# Patient Record
Sex: Female | Born: 1950 | Race: White | Hispanic: No | Marital: Single | Smoking: Former smoker
Health system: Southern US, Community
[De-identification: ages and names within clinical notes are randomized; demographics above are authoritative.]

## PROBLEM LIST (undated history)

## (undated) DIAGNOSIS — G4733 Obstructive sleep apnea (adult) (pediatric): Secondary | ICD-10-CM

## (undated) DIAGNOSIS — IMO0002 Reserved for concepts with insufficient information to code with codable children: Secondary | ICD-10-CM

## (undated) DIAGNOSIS — J439 Emphysema, unspecified: Secondary | ICD-10-CM

## (undated) HISTORY — DX: Obstructive sleep apnea (adult) (pediatric): G47.33

## (undated) HISTORY — DX: Reserved for concepts with insufficient information to code with codable children: IMO0002

## (undated) HISTORY — DX: Emphysema, unspecified: J43.9

## (undated) HISTORY — PX: APPENDECTOMY: SHX54

---

## 2007-10-18 ENCOUNTER — Other Ambulatory Visit: Admission: RE | Admit: 2007-10-18 | Discharge: 2007-10-18 | Payer: Self-pay | Admitting: Family Medicine

## 2008-08-03 ENCOUNTER — Encounter: Admission: RE | Admit: 2008-08-03 | Discharge: 2008-08-03 | Payer: Self-pay | Admitting: Family Medicine

## 2010-08-13 HISTORY — PX: OTHER SURGICAL HISTORY: SHX169

## 2010-09-02 ENCOUNTER — Inpatient Hospital Stay (HOSPITAL_COMMUNITY): Payer: BC Managed Care – PPO

## 2010-09-02 ENCOUNTER — Emergency Department (HOSPITAL_COMMUNITY): Payer: BC Managed Care – PPO

## 2010-09-02 ENCOUNTER — Inpatient Hospital Stay (HOSPITAL_COMMUNITY)
Admission: EM | Admit: 2010-09-02 | Discharge: 2010-09-10 | DRG: 585 | Disposition: A | Discharge status: Home / Self Care | Payer: BC Managed Care – PPO | Attending: Surgery | Admitting: Surgery

## 2010-09-02 DIAGNOSIS — K649 Unspecified hemorrhoids: Secondary | ICD-10-CM | POA: Diagnosis present

## 2010-09-02 DIAGNOSIS — R197 Diarrhea, unspecified: Secondary | ICD-10-CM | POA: Diagnosis not present

## 2010-09-02 DIAGNOSIS — Y838 Other surgical procedures as the cause of abnormal reaction of the patient, or of later complication, without mention of misadventure at the time of the procedure: Secondary | ICD-10-CM | POA: Diagnosis not present

## 2010-09-02 DIAGNOSIS — R0902 Hypoxemia: Secondary | ICD-10-CM | POA: Diagnosis not present

## 2010-09-02 DIAGNOSIS — J9819 Other pulmonary collapse: Secondary | ICD-10-CM | POA: Diagnosis not present

## 2010-09-02 DIAGNOSIS — K55059 Acute (reversible) ischemia of intestine, part and extent unspecified: Secondary | ICD-10-CM | POA: Diagnosis present

## 2010-09-02 DIAGNOSIS — F17200 Nicotine dependence, unspecified, uncomplicated: Secondary | ICD-10-CM | POA: Diagnosis present

## 2010-09-02 DIAGNOSIS — K562 Volvulus: Principal | ICD-10-CM | POA: Diagnosis present

## 2010-09-02 DIAGNOSIS — J4489 Other specified chronic obstructive pulmonary disease: Secondary | ICD-10-CM | POA: Diagnosis present

## 2010-09-02 DIAGNOSIS — J988 Other specified respiratory disorders: Secondary | ICD-10-CM | POA: Diagnosis not present

## 2010-09-02 DIAGNOSIS — K560 Paralytic ileus: Secondary | ICD-10-CM | POA: Diagnosis not present

## 2010-09-02 DIAGNOSIS — J449 Chronic obstructive pulmonary disease, unspecified: Secondary | ICD-10-CM | POA: Diagnosis present

## 2010-09-02 LAB — DIFFERENTIAL
Basophils Absolute: 0 K/uL (ref 0.0–0.1)
Basophils Relative: 0 % (ref 0–1)
Eosinophils Absolute: 0.1 K/uL (ref 0.0–0.7)
Eosinophils Relative: 0 % (ref 0–5)
Lymphocytes Relative: 15 % (ref 12–46)
Lymphs Abs: 2 K/uL (ref 0.7–4.0)
Monocytes Absolute: 0.4 K/uL (ref 0.1–1.0)
Monocytes Relative: 3 % (ref 3–12)
Neutro Abs: 11 K/uL — ABNORMAL HIGH (ref 1.7–7.7)
Neutrophils Relative %: 82 % — ABNORMAL HIGH (ref 43–77)

## 2010-09-02 LAB — CBC
HCT: 40 % (ref 36.0–46.0)
Hemoglobin: 13.9 g/dL (ref 12.0–15.0)
MCH: 30.9 pg (ref 26.0–34.0)
MCHC: 34.8 g/dL (ref 30.0–36.0)
MCV: 88.9 fL (ref 78.0–100.0)
Platelets: 259 K/uL (ref 150–400)
RBC: 4.5 MIL/uL (ref 3.87–5.11)
RDW: 11.7 % (ref 11.5–15.5)
WBC: 13.5 K/uL — ABNORMAL HIGH (ref 4.0–10.5)

## 2010-09-02 LAB — URINALYSIS, ROUTINE W REFLEX MICROSCOPIC
Bilirubin Urine: NEGATIVE
Hgb urine dipstick: NEGATIVE
Ketones, ur: NEGATIVE mg/dL
Urine Glucose, Fasting: NEGATIVE mg/dL
pH: 5.5 (ref 5.0–8.0)

## 2010-09-02 LAB — COMPREHENSIVE METABOLIC PANEL
AST: 37 U/L (ref 0–37)
Albumin: 4.2 g/dL (ref 3.5–5.2)
BUN: 9 mg/dL (ref 6–23)
CO2: 25 mEq/L (ref 19–32)
Calcium: 9.4 mg/dL (ref 8.4–10.5)
Creatinine, Ser: 0.83 mg/dL (ref 0.4–1.2)
GFR calc Af Amer: >60 mL/min (ref >60)
GFR calc non Af Amer: >60 mL/min (ref >60)

## 2010-09-02 LAB — URINE MICROSCOPIC-ADD ON

## 2010-09-02 LAB — POCT CARDIAC MARKERS
CKMB, poc: 4.7 ng/mL (ref 1.0–8.0)
Myoglobin, poc: 99.5 ng/mL (ref 12–200)
Troponin i, poc: <0.05 ng/mL (ref 0.00–0.09)

## 2010-09-02 LAB — LIPASE, BLOOD: Lipase: 19 U/L (ref 11–59)

## 2010-09-02 MED ORDER — IOHEXOL 300 MG/ML  SOLN
100.0000 mL | Freq: Once | INTRAMUSCULAR | Status: AC | PRN
  Administered 2010-09-02: 100 mL via INTRAVENOUS

## 2010-09-03 ENCOUNTER — Inpatient Hospital Stay (HOSPITAL_COMMUNITY): Payer: BC Managed Care – PPO

## 2010-09-03 ENCOUNTER — Other Ambulatory Visit: Payer: Self-pay | Admitting: General Surgery

## 2010-09-03 LAB — BASIC METABOLIC PANEL
CO2: 25 mEq/L (ref 19–32)
Chloride: 101 mEq/L (ref 96–112)
Creatinine, Ser: 0.79 mg/dL (ref 0.4–1.2)
GFR calc Af Amer: >60 mL/min (ref >60)
Potassium: 4.2 mEq/L (ref 3.5–5.1)
Sodium: 133 mEq/L — ABNORMAL LOW (ref 135–145)

## 2010-09-03 LAB — PHOSPHORUS: Phosphorus: 3.7 mg/dL (ref 2.3–4.6)

## 2010-09-03 LAB — CBC
Hemoglobin: 15.4 g/dL — ABNORMAL HIGH (ref 12.0–15.0)
MCH: 31.6 pg (ref 26.0–34.0)
Platelets: 236 10*3/uL (ref 150–400)
RBC: 4.87 MIL/uL (ref 3.87–5.11)
WBC: 10.9 10*3/uL — ABNORMAL HIGH (ref 4.0–10.5)

## 2010-09-03 LAB — SURGICAL PCR SCREEN
MRSA, PCR: NEGATIVE
Staphylococcus aureus: POSITIVE — AB

## 2010-09-03 LAB — MAGNESIUM: Magnesium: 1.9 mg/dL (ref 1.5–2.5)

## 2010-09-04 LAB — BASIC METABOLIC PANEL
BUN: 8 mg/dL (ref 6–23)
Chloride: 98 mEq/L (ref 96–112)
GFR calc Af Amer: >60 mL/min (ref >60)
GFR calc non Af Amer: >60 mL/min (ref >60)
Potassium: 4 mEq/L (ref 3.5–5.1)
Sodium: 133 mEq/L — ABNORMAL LOW (ref 135–145)

## 2010-09-04 LAB — PHOSPHORUS: Phosphorus: 3 mg/dL (ref 2.3–4.6)

## 2010-09-04 LAB — CBC
Platelets: 179 10*3/uL (ref 150–400)
RBC: 3.97 MIL/uL (ref 3.87–5.11)
RDW: 11.9 % (ref 11.5–15.5)
WBC: 10.1 10*3/uL (ref 4.0–10.5)

## 2010-09-04 LAB — MAGNESIUM: Magnesium: 2.1 mg/dL (ref 1.5–2.5)

## 2010-09-05 ENCOUNTER — Inpatient Hospital Stay (HOSPITAL_COMMUNITY): Payer: BC Managed Care – PPO

## 2010-09-08 ENCOUNTER — Inpatient Hospital Stay (HOSPITAL_COMMUNITY): Payer: BC Managed Care – PPO

## 2010-09-08 DIAGNOSIS — J9819 Other pulmonary collapse: Secondary | ICD-10-CM

## 2010-09-08 DIAGNOSIS — J90 Pleural effusion, not elsewhere classified: Secondary | ICD-10-CM

## 2010-09-08 DIAGNOSIS — R599 Enlarged lymph nodes, unspecified: Secondary | ICD-10-CM

## 2010-09-08 DIAGNOSIS — R0902 Hypoxemia: Secondary | ICD-10-CM

## 2010-09-09 ENCOUNTER — Inpatient Hospital Stay (HOSPITAL_COMMUNITY): Payer: BC Managed Care – PPO

## 2010-09-09 DIAGNOSIS — R0902 Hypoxemia: Secondary | ICD-10-CM

## 2010-09-09 DIAGNOSIS — R599 Enlarged lymph nodes, unspecified: Secondary | ICD-10-CM

## 2010-09-09 DIAGNOSIS — J90 Pleural effusion, not elsewhere classified: Secondary | ICD-10-CM

## 2010-09-09 DIAGNOSIS — J9819 Other pulmonary collapse: Secondary | ICD-10-CM

## 2010-09-09 LAB — BASIC METABOLIC PANEL
BUN: 4 mg/dL — ABNORMAL LOW (ref 6–23)
CO2: 31 mEq/L (ref 19–32)
Chloride: 98 mEq/L (ref 96–112)
GFR calc non Af Amer: >60 mL/min (ref >60)
Glucose, Bld: 203 mg/dL — ABNORMAL HIGH (ref 70–99)
Potassium: 2.7 mEq/L — CL (ref 3.5–5.1)

## 2010-09-09 LAB — CBC
MCH: 30.3 pg (ref 26.0–34.0)
RBC: 3.57 MIL/uL — ABNORMAL LOW (ref 3.87–5.11)
RDW: 12.2 % (ref 11.5–15.5)

## 2010-09-09 LAB — MAGNESIUM: Magnesium: 1.9 mg/dL (ref 1.5–2.5)

## 2010-09-09 MED ORDER — IOHEXOL 300 MG/ML  SOLN
80.0000 mL | Freq: Once | INTRAMUSCULAR | Status: AC | PRN
  Administered 2010-09-09: 80 mL via INTRAVENOUS

## 2010-09-10 DIAGNOSIS — J438 Other emphysema: Secondary | ICD-10-CM

## 2010-09-10 LAB — COMPREHENSIVE METABOLIC PANEL
ALT: 40 U/L — ABNORMAL HIGH (ref 0–35)
AST: 44 U/L — ABNORMAL HIGH (ref 0–37)
Albumin: 2.6 g/dL — ABNORMAL LOW (ref 3.5–5.2)
Alkaline Phosphatase: 50 U/L (ref 39–117)
Chloride: 103 mEq/L (ref 96–112)
GFR calc Af Amer: >60 mL/min (ref >60)
Potassium: 4.4 mEq/L (ref 3.5–5.1)
Sodium: 137 mEq/L (ref 135–145)
Total Bilirubin: 0.6 mg/dL (ref 0.3–1.2)
Total Protein: 5.8 g/dL — ABNORMAL LOW (ref 6.0–8.3)

## 2010-09-18 ENCOUNTER — Encounter: Payer: Self-pay | Admitting: Pulmonary Disease

## 2010-09-18 NOTE — H&P (Signed)
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  HISTORY OF PRESENT ILLNESS:  Ms. Kuba is a 60 year old female who was awakened at 2 a.m. this morning with severe abdominal pain.  Over the course of the day, she has noticed that she has had a little bit of nausea and some bloating.  She describes no prior symptoms whatsoever. Before 2 a.m. she did not have any nausea, vomiting, constipation, diarrhea, or change in her appetite.  She describes no change in the caliber of her stool or weight loss.  No bleeding per rectum.  She did have a colonoscopy around 3 to 4 years ago with Dr. Elnoria Howard.  She said had diverticuli, but nothing else.  She describes her pain as 10/10 on admission which is down to around 2/10 after the pain medication.  PAST MEDICAL HISTORY:  Significant for no chronic illnesses.  PAST SURGICAL HISTORY:  Appendectomy.  FAMILY HISTORY:  Coronary artery disease and diabetes.  Sister with breast cancer in her 76s.  SOCIAL HISTORY:  She does not use any illicit drugs.  She is a former smoker and drinks around five alcohol beverages per week.  She is accompanied by friends.  REVIEW OF SYSTEMS:  Otherwise negative x 11 systems.  MEDICATIONS:  None.  ALLERGIES:  None.  PHYSICAL EXAMINATION:  GENERAL:  She is alert and oriented x3.  She is lying on her side and looks uncomfortable. VITAL SIGNS:  Temperature 97.4, heart rate 80, blood pressure 142/71, respiratory rate 20, and saturation 95% on room air.  She is alert and oriented x3. HEENT:  Normocephalic and atraumatic.  Sclerae are anicteric. NECK:  Supple.  No  lymphadenopathy.  No thyromegaly.  Trachea is midline.  Mucous membranes are moist. HEART:  Regular rate and rhythm.  No murmurs, rubs, or gallops. LUNGS:  Clear bilaterally.  No wheezing, rales, or rhonchi. ABDOMEN:  Soft and nontender.  It is distended and there are hypoactive bowel sounds.  She has a McBurney's incision in the right lower quadrant. MUSCULOSKELETAL:  No gross deformities.  No rashes are seen. NEUROLOGIC:  No gross motor or sensory deficits. PSYCHIATRIC:  Mood and affect are normal.  LABORATORY DATA:  Sodium 133, potassium 3.7, chloride 99, CO2 25, BUN 9, creatinine 0.83, glucose 127, and calcium 9.4.  LFTs normal.  White count 13.5, hemoglobin and hematocrit 13.9 and 40.0, and platelet count 259,000.  CT scan is positive for small amount of pelvic ascites and volvulus versus mass of the sigmoid.  There is narrowing of the lumen.  ASSESSMENT AND PLAN:  Ms. Ra is a 60 year old female with a volvulus.  I think this is much more likely than a mass given the acute nature of the pain and lack of  preceding symptoms.  She also had anormal colonoscopy around 3 years ago.  We will admit her and keep her n.p.o., give her IV fluids, IV antibiotics, and then GI consult for flexible sigmoidoscopy.  I have spoken to Dr. Elnoria Howard.  She will need some pain control and antibiotics and may possibly need surgery in the future.  This was assessed with the patient and she understands.     Almond Lint, MD     FB/MEDQ  D:  09/02/2010  T:  09/03/2010  Job:  [id number]  cc:   [facility]  Jordan Hawks. Elnoria Howard, MD Fax: [phone]  Electronically Signed by Almond Lint MD on 09/17/2010 01:07:20 PM

## 2010-09-18 NOTE — Op Note (Signed)
NAMEQUINTESSA, [name]               ACCOUNT NO.:  [id number]  MEDICAL RECORD NO.:  [id number]           PATIENT TYPE:  I  LOCATION:  1537                         FACILITY:  [facility]  PHYSICIAN:  Almond Lint, MD       DATE OF BIRTH:  12-Oct-1950  DATE OF PROCEDURE:  09/03/2010 DATE OF DISCHARGE:                              OPERATIVE REPORT   PREOPERATIVE DIAGNOSIS:  Sigmoid volvulus.  POSTOPERATIVE DIAGNOSIS:  Cecal volvulus.  PROCEDURE:  Ileocecectomy and On-Q pain pump placement.  SURGEON:  Almond Lint, MD  ASSISTANT:  Earney Hamburg, P.A.  ANESTHESIA:  General and local.  FINDINGS:  Necrotic cecum.  SPECIMENS:  Cecum to Pathology.  ESTIMATED BLOOD LOSS:  100 mL.  COMPLICATIONS:  None known.  PROCEDURE:  Hailey [name] was identified in the holding area and taken to the operating where she was placed supine on the operating room table. General anesthesia was induced.  Her abdomen and perineum were prepped and draped in sterile fashion.  Time-out was performed according to the surgical safety check list.  When all was correct, we continued.  A midline incision was made with a #10 blade.  Deep subcutaneous tissue was divided with cautery.  The abdomen was entered gently in the midline with the cautery.  The peritoneum was opened carefully by holding up with 2 tonsils and cutting with the Metzenbaum.  The fascia was opened up to full length of the skin incision.  There was immediately some murky bloody fluid in the abdomen.  Immediately visible was a large necrotic loop of colon.  This was delivered from the abdomen and detorsed.  This, after detorsing, was clearly the cecum.  Interestingly enough however, the ascending colon did go all the way down to the right lower quadrant with the white line of Toldt.  It was lifted as the actual cecum was free with a fixed ascending colon and hepatic flexure. She did not have any evidence of malrotation.  The ileum was identified and  a GIA 75 stapler was fired across this after creation of a mesenteric window.  The ascending colon was identified and a GIA 75 was passed across this.  The mesentery was then taken with the super draw. There were no bleeding vessels along the mesentery.  The specimen was passed off the table without any injury to the colon.  The hepatic flexure was then taken down with the cautery and blunt dissection.  Once this was done, the anastomosis could be performed.  Terminal ileum and the ascending colon were secured with the 3-0 Vicryl.  A crotch stitch was placed around 7 cm beyond this.  The bowel clamps were used to prevent backflow of fluid and gas.  The staple lines of the bowel were opened with cautery.  The GIA 75 was passed across and fired creating a side-to-side functional end-to-end anastomosis.  The defect was then examined and there was no bleeding seen from the staple line.  The defect was reapproximated with Allis clamps and a TA-60 was fired across this. Curved Mayo scissors were used to cut off the additional tissue beyond the stapler.  Gloves were changed and then the mesenteric defect was closed with running 2-0 Vicryl.  The abdomen was then copiously irrigated.  Additional apex stitch was placed at the crotch of the anastomosis for support.  5 L of saline was used to irrigate the abdomen.  The bowel was placed back in the abdominal cavity and the omentum was pulled down on top of the bowel.  An On-Q tunneler was placed in the lateral abdomen.  The peritoneum was then closed using a running 0 Vicryl.  The fascia was then closed using #1 PDS suture.  The skin was irrigated and closed with staples.  The catheters for the pain pump were placed through the tunnelers and these were bolused with 0.5% Marcaine plain.  The incision was cleaned, dried and dressed with a Coverlet dressing and the On-Q pain pumps were dressed with Tegaderm and Steri-Strips.  The patient was awakened from  anesthesia and taken to the PACU in a stable condition.  Needle, sponge and instrument counts were correct x2.     Almond Lint, MD     FB/MEDQ  D:  09/03/2010  T:  09/03/2010  Job:  [id number]  Electronically Signed by Almond Lint MD on 09/17/2010 01:08:46 PM

## 2010-09-18 NOTE — Discharge Summary (Signed)
Hailey [name], Hailey [name]               ACCOUNT NO.:  [id number]  MEDICAL RECORD NO.:  [id number]           PATIENT TYPE:  I  LOCATION:  1537                         FACILITY:  [facility]  PHYSICIAN:  Almond Lint, MD       DATE OF BIRTH:  [birth year]  DATE OF ADMISSION:  09/02/2010 DATE OF DISCHARGE:  09/10/2010                              DISCHARGE SUMMARY   ADMISSION DIAGNOSES: 1. Volvulus versus mass of the sigmoid colon. 2. Tobacco use. 3. History of appendectomy.  DISCHARGE DIAGNOSES: 1. 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 2. Postop hypoxia secondary to atelectasis and baseline chronic     obstructive pulmonary disease. 3. History of appendectomy.  PROCEDURES: 1. CT of the abdomen and pelvis, September 02, 2010. 2. Exploratory laparotomy and ileocecectomy, September 03, 2010, Dr.     Donell Beers. 3. CT of the chest, September 09, 2010.  CONSULTS:  Coralyn Helling, MD, Pulmonary.  BRIEF HISTORY:  The patient is a 60 year old female who awoke the morning of admission with severe abdominal pain.  Over the course the day, symptoms became worse.  She developed nausea and vomiting.  She had no prior symptoms before that.  She did not have any nausea, vomiting, constipation, diarrhea or change in her appetite.  She came to the ER and underwent evaluation.  CT scan obtained at that time showed focal distention of the sigmoid colon, decreased caliber of the descending colon and the rectum.  In the acute setting, this was thought to represent an sigmoid volvulus or focal obstruction due to a mass. There were some abdominal and pelvic ascites and small hypodensities within the liver parenchyma was too small to characterize.  There was distention at gastric lumen without evidence for high-grade outlet obstruction.  Based on the appearance of the CT, the patient went to endoscopy for decompression and detorsion.  She felt better in the evening after the procedure, but had worse pain  overnight. On September 03, 2010, she underwent the above-noted procedure, tolerated well and returned to the floor and did well.  Her diet was slowly advanced and she was up ambulating and actually doing quite well after her surgery on the fifth postoperative day.  At that time, it was noted by the staff that she had dropped her sats when out of bed and off oxygen.  They walked her which actually dropped her sats down into the 80s.  Her chest x-ray on February 24 showed bilateral small effusions, low lung volumes and emphysema consistent with atelectasis.  She had a history of tobacco use, one and a half packs a day for approximately 27 years.  At that point, we obtained a pulmonary consult from Dr. Craige Cotta.  He repeated her chest x-ray the following day and started her on some albuterol.  Because her sats were still down, she had some increasing effusion on the right and bibasilar atelectasis.  They recommended a CT just her rule out PE to get a better look at her pulmonary architecture.  CT was obtained on September 09, 2010.  This showed no evidence of pulmonary embolism. There were small bilateral  effusions and bibasilar atelectasis.  There is also a moderate amount of pulmonary emphysema and actually more than the expected.  The patient has been placed on albuterol.  She was on incentive.  A flutter valve was added and they started her on Spiriva. We tentatively plan to send her home on oxygen but walk test today showed her sats were improved, greater than the 88% and we opted to discontinue her oxygen.  She is to continue her pulmonary toilet and Spiriva.  From a GI standpoint, she is doing very well.  She is eating regular diet.  She is having some diarrhea and some irritation of her hemorrhoids, we put her on some Anusol for this but overall she is fine. She had her staples removed and Steri-Strips applied with instructions to remove those, in about a week they will come off.  She will  follow up with Dr. Donell Beers in 7 days to 2 weeks.  She will follow up with Dr. Craige Cotta in 2 weeks, appointment has been made.  DISCHARGE MEDS: 1. Will include her preadmission calcium 600 mg daily. 2. Vitamin C 500 mg daily. 3. Vitamin E daily. 4. Hydrocortisone cream p.r.n. for her hemorrhoids. 5. Oxycodone APAP 5/325, 1-2 tablets q.4 p.r.n. 6. She was also told she could take ibuprofen or plain Tylenol p.r.n. 7. She was started on Spiriva Aerolizer 18 mcg 1 inhalation daily.  We     gave her back up prescriptions for the Spiriva and albuterol if she     should need it.  The Pulmonary has already called in her     prescriptions for that.  She is instructed to call if she has any     problems.  We have an Urgent Care Clinic in the ER with 24-hour     followup as needed.     Eber Hong, P.A.   ______________________________ Almond Lint, MD    WDJ/MEDQ  D:  09/10/2010  T:  09/10/2010  Job:  [id number]  cc:   Coralyn Helling, MD [address] [place], [place] [address]  Marcos Eke. Hal Hope, M.D. Fax: [phone]  Electronically Signed by Sherrie George P.A. on 09/11/2010 04:33:23 PM Electronically Signed by Almond Lint MD on 09/17/2010 01:11:57 PM

## 2010-09-30 ENCOUNTER — Encounter: Payer: Self-pay | Admitting: Pulmonary Disease

## 2010-10-01 ENCOUNTER — Ambulatory Visit (INDEPENDENT_AMBULATORY_CARE_PROVIDER_SITE_OTHER)
Admission: RE | Admit: 2010-10-01 | Discharge: 2010-10-01 | Disposition: A | Discharge status: Home / Self Care | Payer: BC Managed Care – PPO | Source: Ambulatory Visit | Attending: Pulmonary Disease | Admitting: Pulmonary Disease

## 2010-10-01 ENCOUNTER — Encounter: Payer: Self-pay | Admitting: Pulmonary Disease

## 2010-10-01 ENCOUNTER — Ambulatory Visit (INDEPENDENT_AMBULATORY_CARE_PROVIDER_SITE_OTHER): Payer: BC Managed Care – PPO | Admitting: Pulmonary Disease

## 2010-10-01 DIAGNOSIS — J9811 Atelectasis: Secondary | ICD-10-CM

## 2010-10-01 DIAGNOSIS — J438 Other emphysema: Secondary | ICD-10-CM

## 2010-10-01 DIAGNOSIS — J90 Pleural effusion, not elsewhere classified: Secondary | ICD-10-CM | POA: Insufficient documentation

## 2010-10-01 DIAGNOSIS — J9819 Other pulmonary collapse: Secondary | ICD-10-CM

## 2010-10-01 MED ORDER — ALBUTEROL SULFATE HFA 108 (90 BASE) MCG/ACT IN AERS: 2.0000 | INHALATION_SPRAY | Freq: Four times a day (QID) | RESPIRATORY_TRACT | Status: DC | PRN

## 2010-10-01 NOTE — Assessment & Plan Note (Signed)
Has clinical improvement.  Will repeat chest xray today. 

## 2010-10-01 NOTE — Patient Instructions (Signed)
Chest xray today Spiriva one puff daily Proair two puffs up to four times per day as needed for cough, wheeze, chest congestion, or shortness of breath

## 2010-10-01 NOTE — Assessment & Plan Note (Signed)
Has clinical improvement.  Will repeat chest xray today.

## 2010-10-01 NOTE — Progress Notes (Signed)
  Subjective:    Patient ID: Hailey [name], female    DOB: 06-16-1951, 60 y.o.   MRN: [id number]  HPI CC: Hailey [name]. Hailey [name]   60 yo female with emphysema, will small pleural effusion and atelectasis after repair of sigmoid volvulus.  She continues to use spiriva.  She is not sure how much this is helping.  She does not have an albuterol inhaler.  She has been cleared by surgery to return to exercise.  She is starting to do cardio activities w/o difficulty with her breathing.   She has not had breathing tests before.  She has not had a chest xray since she was discharged from the hospital.  No resp symptoms no cxr No pft Had ptx 25 yrs ago, lt side No smoke Ok to start cardio [employer], [facility] [place]    Review of Systems  Constitutional: Negative for fever.  HENT: Negative for congestion, rhinorrhea and postnasal drip.   Respiratory: Negative for cough, chest tightness, shortness of breath and wheezing.   Cardiovascular: Negative for chest pain and leg swelling.  Gastrointestinal: Negative for nausea and abdominal pain.  Musculoskeletal: Negative for back pain and joint swelling.  Skin: Negative for rash.  Neurological: Negative for headaches.  Hematological: Negative for adenopathy.       Objective:   Physical Exam  Constitutional: She is oriented to person, place, and time. She appears well-developed and well-nourished. No distress.  HENT:  Nose: Nose normal.  Mouth/Throat: Oropharynx is clear and moist. No oropharyngeal exudate.  Eyes: EOM are normal. Pupils are equal, round, and reactive to light.  Neck: Neck supple. No JVD present. No tracheal deviation present.  Cardiovascular: Normal rate, regular rhythm and normal heart sounds.   No murmur heard. Pulmonary/Chest: Effort normal. No respiratory distress. She has no wheezes. She has no rales. She exhibits no tenderness.  Abdominal: Soft. Bowel sounds are normal.  Musculoskeletal: She exhibits no edema.    Lymphadenopathy:    She has no cervical adenopathy.  Neurological: She is alert and oriented to person, place, and time. No cranial nerve deficit.  Skin: Skin is warm and dry.  Psychiatric: She has a normal mood and affect.          Assessment & Plan:

## 2010-10-01 NOTE — Assessment & Plan Note (Signed)
She has severe obstruction on spirometry.  She has radiographic evidence of emphysema with prior history of tobacco abuse.  Will continue spiriva, and give as needed albuterol.

## 2010-10-02 ENCOUNTER — Telehealth: Payer: Self-pay | Admitting: Pulmonary Disease

## 2010-10-02 NOTE — Consult Note (Signed)
Hailey [name], Hailey [name]               ACCOUNT NO.:  [id number]  MEDICAL RECORD NO.:  [id number]           PATIENT TYPE:  I  LOCATION:  1537                         FACILITY:  [facility]  PHYSICIAN:  Coralyn Helling, MD        DATE OF BIRTH:  1951-02-28  DATE OF CONSULTATION:  09/08/2010 DATE OF DISCHARGE:                                CONSULTATION   REASON FOR CONSULTATION:  Hypoxia.  CONSULTING PHYSICIAN:  Dr. Johna Sheriff.  HISTORY OF PRESENT ILLNESS:  This is a very pleasant 60 year old female who reports that she was in her usual state of health up until September 02, 2010, when she was awoken that morning with severe abdominal discomfort.  Diagnostic evaluation ultimately demonstrated sigmoid volvulus.  She ultimately underwent emergent operation on September 03, 2010, which consisted of ileal cecotomy with postoperative diagnosis of cecal volvulus.  This is carried out by Dr. Almond Lint.  Her postoperative course actually was progressing quite well up until September 08, 2010.  At that time, she was noted to have episodes of desaturation as low as 81% on room air with activity.  Interestingly enough, Hailey [name] denied significant dyspnea.  She denied new cough, wheezing, chest pain.  Actually reports some mild exertional dyspnea after walking two laps in the hallway; however, she contributed this to simply fatigue following a major surgery.  The Pulmonary Service was asked to evaluate on September 08, 2010, to further evaluate her hypoxia.  PAST MEDICAL HISTORY:  Her past medical history is negative for chronic diseases.  She has had a prior appendectomy.  She was a prior smoker, but stopped 10 years ago.  PAST SURGICAL HISTORY:  As mentioned above, is appendectomy.  FAMILY HISTORY:  Her father had coronary artery disease and diabetes. She had a sister with breast cancer.  She has no history of COPD, emphysema, asthma, or pulmonary or thrombotic diseases.  SOCIAL HISTORY:  Former  smoker, stopped smoking approximately 10 years ago; she did smoke approximately one pack a day for 20 years.  She drinks socially.  She currently works for [employer] at home.  She lives a very active lifestyle, reports she sees a Psychologist, educational and works out three times a week doing multiple activities which include resistance training and aerobic activity.  She reports normally that dyspnea is not a barrier to exertion.  ALLERGIES:  No known drug allergies.  CURRENT MEDICATIONS: 1. Entereg 12 mg p.o. b.i.d. 2. Peridex b.i.d. 3. Lovenox 40 mg daily.  This was started on postop day #1. 4. Dilaudid p.r.n. 5. Zofran p.r.n. 6. Percocet p.r.n. 7. Phenergan p.r.n.  REVIEW OF SYSTEMS:  GENERAL:  Reports to feel no acute complaints following her major abdominal surgery.  HEENT:  Denies nasal drainage, sore throat, adenopathy.  PULMONARY:  Denies cough, chest pain, wheeze. Does endorse mild exertional dyspnea, again which she attributed to expected postoperative fatigue.  CARDIAC:  No palpitations, no chest pain, no orthopnea.  EXTREMITIES:  Denies swelling.  ABDOMEN:  No nausea.  No vomiting.  Tolerating diet now.  GU:  No difficulties. NEUROMUSCULAR:  No difficulties.  NEURO:  No difficulties.  PSYCHIATRIC: Within normal.  CURRENT PHYSICAL EXAM:  VITAL SIGNS:  Temperature 98.3, heart rate 76 to 86, respirations 20, blood pressure 124/76, saturation 96% on 2 L. Please note she apparently desaturated to 81% on room air with exertion. GENERAL PHYSICAL EXAM:  This is a well-developed 60 year old female in no acute distress on 2 L nasal cannula. HEENT:  She is normocephalic without jugular venous distention. PULMONARY:  Notable for crackles, right greater than left, in the bases. Her air entry is equal bilaterally without accessory muscle use. CARDIAC:  Regular rate and rhythm. ABDOMEN:  Soft, nontender. EXTREMITIES:  Without edema, tenderness, or pain. NEUROLOGICALLY:  Grossly  intact.  DIAGNOSTIC EVALUATION:  Chest x-ray personally reviewed on September 08, 2010, does demonstrate progressive right-sided basilar atelectasis which is increased since her prior chest film on the 24th.  She also has a small amount of atelectasis on the left; cannot rule out some degree of bilateral effusions.  Last blood chemistry, February 23:  Sodium 133, potassium 4, chloride 98, CO2 30, glucose 109, BUN 8, creatinine 0.73.  Hemoglobin 12.1, hematocrit 36, platelet count 179, white blood cell count 10.1.  IMPRESSION/PLAN:  Hypoxia.  This is most likely secondary to postoperative atelectasis.  She has a small amount of pleural effusion; however, I doubt this is contributing to her degree of exertional hypoxia.  No evidence of a pneumonia.  This would be a very low probability of pulmonary emboli given she has been on prophylactic Lovenox since postoperative day #1.  Hailey [name] could indeed have some degree of underlying chronic obstructive pulmonary disease which has been undiagnosed in the past.  Certainly this is a consideration given how well she has tolerated her hypoxia.  Recommendations at this point would be to first focus on pulmonary hygiene measures.  We have gone ahead and ordered pulmonary hygiene in the form of bronchodilators, flutter valve, EZ-PAP, and incentive spirometry.  We will follow her chest x-ray and her symptomatic process.  After follow-up chest x-ray and in evaluation of her current clinical status, we will later decide on timing of potential pulmonary function testing to further answer the question as to whether or not she has underlying obstructive lung disease.  Finally, it is likely Hailey [name] will require supplemental oxygen upon time of discharge.  Thank you for the opportunity to see Hailey [name].  We will continue to follow her along with you.     Zenia Resides, NP   ______________________________ Coralyn Helling, MD    PB/MEDQ  D:   09/08/2010  T:  09/08/2010  Job:  [id number]  Electronically Signed by Zenia Resides NP on 09/29/2010 10:09:13 PM Electronically Signed by Coralyn Helling MD on 10/02/2010 12:18:30 PM

## 2010-10-02 NOTE — Telephone Encounter (Signed)
CXR reviewed.  Will have my nurse call to inform patient that chest xray shows emphysema as before, but that fluid around lungs as resolved.  No change to current tx plan.

## 2010-10-02 NOTE — Telephone Encounter (Signed)
Lm with family member for pt to call back.

## 2010-10-03 NOTE — Telephone Encounter (Signed)
Carver Fila, CMA Carver Fila, [place]

## 2010-10-03 NOTE — Telephone Encounter (Signed)
Spoke with pt and notified of the above cxr results per VS.  Pt verbalized understanding.

## 2010-10-14 NOTE — Consult Note (Signed)
  Hailey [name], Hailey [name]               ACCOUNT NO.:  [id number]  MEDICAL RECORD NO.:  [id number]           PATIENT TYPE:  I  LOCATION:  1307                         FACILITY:  [facility]  PHYSICIAN:  Jordan Hawks. Elnoria Howard, MD    DATE OF BIRTH:  05/02/1951  DATE OF CONSULTATION:  09/02/2010 DATE OF DISCHARGE:                                CONSULTATION   REASON FOR CONSULTATION:  Sigmoid volvulus.  HISTORY OF PRESENT ILLNESS:  This is a 60 year old female without any significant past medical history who status post appendectomy was admitted to the hospital with complaints of severe abdominal pain.  Pain started acutely at 2:30 a.m., it woke her up.  Before this time, she did not have any issues with her colon.  She underwent a colonoscopy by Dr. Elnoria Howard in 2007 with negative findings, and she has been well from the GI standpoint since that time.  When she arrived to the hospital, further workup revealed that she had a sigmoid volvulus and subsequently GI consultation was requested for further evaluation and treatment.  PAST MEDICAL HISTORY AND PAST SURGICAL HISTORY:  As stated above.  FAMILY HISTORY:  Noncontributory.  SOCIAL HISTORY:  Negative for illicit drug use or tobacco, although she is a former smoker, and she is positive for alcohol approximately 5 drinks per week.  REVIEW OF SYSTEMS:  As stated above in history of present illness, otherwise negative.  HOME MEDICATIONS:  None.  ALLERGIES:  No known drug allergies.  PHYSICAL EXAMINATION:  VITAL SIGNS:  Stable. GENERAL:  The patient is in no acute distress, alert and oriented. HEENT:  Normocephalic, atraumatic.  Extraocular muscles intact. NECK:  Supple.  No lymphadenopathy. LUNGS:  Clear to auscultation bilaterally. CARDIOVASCULAR:  Regular rate and rhythm. ABDOMEN:  Tense, distended, and tympanic.  It is tender to palpation but no rebound. EXTREMITIES:  No clubbing, cyanosis, or edema.  LABORATORY VALUES:  Reviewed at this  time.  IMPRESSION AND PLAN:  Sigmoid volvulus.  I am uncertain about the cause of the sigmoid volvulus.  I doubt that there is an obstructive mass that has resulted in this type of situation as her colonoscopy was well prepped and it was negative approximately 5 years ago.  Further evaluation with a sigmoidoscopy and an attempt to decompress the sigmoid colon will be performed.  Further recommendations will be made pending the findings.     Jordan Hawks Elnoria Howard, MD     PDH/MEDQ  D:  09/03/2010  T:  09/03/2010  Job:  [id number]  Electronically Signed by Jeani Hawking MD on 10/14/2010 08:51:43 AM

## 2010-12-12 ENCOUNTER — Telehealth: Payer: Self-pay | Admitting: Pulmonary Disease

## 2010-12-12 MED ORDER — TIOTROPIUM BROMIDE MONOHYDRATE 18 MCG IN CAPS: 18.0000 ug | ORAL_CAPSULE | Freq: Every day | RESPIRATORY_TRACT | Status: DC

## 2010-12-12 NOTE — Telephone Encounter (Signed)
Called spoke with patient to verify that she is requesting refills on her spiriva to [facility].  Pt last seen by VS 3.21.12, to follow up 6.6.12.  Refills sent to [facility].

## 2010-12-16 ENCOUNTER — Ambulatory Visit (INDEPENDENT_AMBULATORY_CARE_PROVIDER_SITE_OTHER): Payer: BC Managed Care – PPO | Admitting: Pulmonary Disease

## 2010-12-16 ENCOUNTER — Encounter: Payer: Self-pay | Admitting: Pulmonary Disease

## 2010-12-16 DIAGNOSIS — J439 Emphysema, unspecified: Secondary | ICD-10-CM

## 2010-12-16 DIAGNOSIS — J438 Other emphysema: Secondary | ICD-10-CM

## 2010-12-16 NOTE — Assessment & Plan Note (Signed)
She is not sure spiriva is helping.  She has not needed to use her rescue inhaler.  Will have her stop spiriva and monitor her symptoms.  She is to call if she notices any difference.  Otherwise, can follow up with pulmonary as needed.  Also discussed testing for alpha 1 anti-trypsin deficiency.  She is not aware of any family history of emphysema, and did not feel this testing was needed at this time.

## 2010-12-16 NOTE — Patient Instructions (Signed)
Stop spiriva Call if you notice your breathing gets worse after spiriva stopped Otherwise, follow up with pulmonary as needed

## 2010-12-16 NOTE — Progress Notes (Signed)
Subjective:    Patient ID: Hailey [name], female    DOB: 01-16-1951, 60 y.o.   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  She continues to use spiriva reluctantly.  She does not think this has helped much.  She has not been using her albuterol.  She denies cough, wheeze, sputum, chest pain, or hemoptysis.  She was told by her trainer that her recovery time has improved recently when she is exercising.  Past Medical History  Diagnosis Date  . Emphysema   . Pneumothorax      Family History  Problem Relation Age of Onset  . Coronary artery disease Father   . Diabetes Father   . Cancer Sister     breast     History   Social History  . Marital Status: Single    Spouse Name: N/A    Number of Children: N/A  . Years of Education: N/A   Occupational History  . Not on file.   Social History Main Topics  . Smoking status: Former Smoker -- 1.0 packs/day for 20 years    Types: Cigarettes    Quit date: 07/13/2000  . Smokeless tobacco: Not on file  . Alcohol Use: Yes     occasional dinks  . Drug Use: Not on file  . Sexually Active: Not on file   Other Topics Concern  . Not on file   Social History Narrative  . No narrative on file     No Known Allergies   Outpatient Prescriptions Prior to Visit  Medication Sig Dispense Refill  . Ascorbic Acid (VITAMIN C) 500 MG tablet Take 500 mg by mouth daily.        . calcium carbonate (OS-CAL) 600 MG TABS Take 600 mg by mouth daily.        . hydrocortisone 0.5 % cream Apply topically as needed.        Marland Kitchen oxyCODONE-acetaminophen (PERCOCET) 5-325 MG per tablet Take 1 tablet by mouth every 4 (four) hours as needed.        . tiotropium (SPIRIVA) 18 MCG inhalation capsule Place 1 capsule (18 mcg total) into inhaler and inhale daily.  90 capsule  3  . albuterol (PROAIR HFA) 108 (90 BASE) MCG/ACT inhaler Inhale 2 puffs into the lungs 4 (four) times daily as needed for wheezing or shortness of breath.  1 Inhaler  6    Review of Systems     Objective:   Physical Exam  BP 114/80  Pulse 73  Temp(Src) 97.7 F (36.5 C) (Oral)  Ht 5\' 2"  (1.575 m)  Wt 117 lb (53.071 kg)  BMI 21.40 kg/m2  SpO2 95%  Constitutional: She is oriented to person, place, and time. She appears well-developed and well-nourished. No distress.  HENT:  Nose: Nose normal.  Mouth/Throat: Oropharynx is clear and moist. No oropharyngeal exudate.  Eyes: EOM are normal. Pupils are equal, round, and reactive to light.  Neck: Neck supple. No JVD present. No tracheal deviation present.  Cardiovascular: Normal rate, regular rhythm and normal heart sounds.  No murmur heard.  Pulmonary/Chest: Effort normal. No respiratory distress. She has no wheezes. She has no rales. She exhibits no tenderness.  Abdominal: Soft. Bowel sounds are normal.  Musculoskeletal: She exhibits no edema.  Lymphadenopathy:  She has no cervical adenopathy.  Neurological: She is alert and oriented to person, place, and time. No cranial nerve deficit.  Skin: Skin is warm and dry.  Psychiatric: She has a normal mood and  affect.       Assessment & Plan:   Emphysema She is not sure spiriva is helping.  She has not needed to use her rescue inhaler.  Will have her stop spiriva and monitor her symptoms.  She is to call if she notices any difference.  Otherwise, can follow up with pulmonary as needed.  Also discussed testing for alpha 1 anti-trypsin deficiency.  She is not aware of any family history of emphysema, and did not feel this testing was needed at this time.    Updated Medication List Outpatient Encounter Prescriptions as of 12/16/2010  Medication Sig Dispense Refill  . Ascorbic Acid (VITAMIN C) 500 MG tablet Take 500 mg by mouth daily.        . calcium carbonate (OS-CAL) 600 MG TABS Take 600 mg by mouth daily.        . hydrocortisone 0.5 % cream Apply topically as needed.        Marland Kitchen oxyCODONE-acetaminophen (PERCOCET) 5-325 MG per tablet Take 1 tablet by  mouth every 4 (four) hours as needed.        Marland Kitchen DISCONTD: tiotropium (SPIRIVA) 18 MCG inhalation capsule Place 1 capsule (18 mcg total) into inhaler and inhale daily.  90 capsule  3  . albuterol (PROAIR HFA) 108 (90 BASE) MCG/ACT inhaler Inhale 2 puffs into the lungs 4 (four) times daily as needed for wheezing or shortness of breath.  1 Inhaler  6

## 2011-05-04 ENCOUNTER — Ambulatory Visit: Payer: BC Managed Care – PPO | Attending: Family Medicine | Admitting: Physical Therapy

## 2011-05-04 DIAGNOSIS — M25619 Stiffness of unspecified shoulder, not elsewhere classified: Secondary | ICD-10-CM | POA: Insufficient documentation

## 2011-05-04 DIAGNOSIS — IMO0001 Reserved for inherently not codable concepts without codable children: Secondary | ICD-10-CM | POA: Insufficient documentation

## 2011-05-04 DIAGNOSIS — M25519 Pain in unspecified shoulder: Secondary | ICD-10-CM | POA: Insufficient documentation

## 2011-05-07 ENCOUNTER — Ambulatory Visit: Payer: BC Managed Care – PPO | Admitting: Physical Therapy

## 2011-05-11 ENCOUNTER — Ambulatory Visit: Payer: BC Managed Care – PPO

## 2011-05-13 ENCOUNTER — Ambulatory Visit: Payer: BC Managed Care – PPO

## 2011-05-18 ENCOUNTER — Ambulatory Visit: Payer: BC Managed Care – PPO | Attending: Family Medicine

## 2011-05-18 DIAGNOSIS — M25619 Stiffness of unspecified shoulder, not elsewhere classified: Secondary | ICD-10-CM | POA: Insufficient documentation

## 2011-05-18 DIAGNOSIS — M25519 Pain in unspecified shoulder: Secondary | ICD-10-CM | POA: Insufficient documentation

## 2011-05-18 DIAGNOSIS — IMO0001 Reserved for inherently not codable concepts without codable children: Secondary | ICD-10-CM | POA: Insufficient documentation

## 2011-05-20 ENCOUNTER — Ambulatory Visit: Payer: BC Managed Care – PPO

## 2011-05-27 ENCOUNTER — Ambulatory Visit: Payer: BC Managed Care – PPO

## 2011-06-01 ENCOUNTER — Encounter: Payer: BC Managed Care – PPO | Admitting: Physical Therapy

## 2012-05-30 ENCOUNTER — Other Ambulatory Visit (HOSPITAL_COMMUNITY)
Admission: RE | Admit: 2012-05-30 | Discharge: 2012-05-30 | Disposition: A | Discharge status: Home / Self Care | Payer: 59 | Source: Ambulatory Visit | Attending: Family Medicine | Admitting: Family Medicine

## 2012-05-30 ENCOUNTER — Other Ambulatory Visit: Payer: Self-pay | Admitting: Family Medicine

## 2012-05-30 DIAGNOSIS — Z124 Encounter for screening for malignant neoplasm of cervix: Secondary | ICD-10-CM | POA: Insufficient documentation

## 2013-09-13 ENCOUNTER — Other Ambulatory Visit: Payer: Self-pay | Admitting: Family Medicine

## 2013-09-13 DIAGNOSIS — R9389 Abnormal findings on diagnostic imaging of other specified body structures: Secondary | ICD-10-CM

## 2013-09-15 ENCOUNTER — Ambulatory Visit
Admission: RE | Admit: 2013-09-15 | Discharge: 2013-09-15 | Disposition: A | Discharge status: Home / Self Care | Payer: 59 | Source: Ambulatory Visit | Attending: Family Medicine | Admitting: Family Medicine

## 2013-09-15 DIAGNOSIS — R9389 Abnormal findings on diagnostic imaging of other specified body structures: Secondary | ICD-10-CM

## 2013-09-15 MED ORDER — IOHEXOL 300 MG/ML  SOLN
75.0000 mL | Freq: Once | INTRAMUSCULAR | Status: AC | PRN
  Administered 2013-09-15: 75 mL via INTRAVENOUS

## 2013-10-04 ENCOUNTER — Ambulatory Visit: Payer: BC Managed Care – PPO | Admitting: Pulmonary Disease

## 2013-10-06 ENCOUNTER — Encounter: Payer: Self-pay | Admitting: Pulmonary Disease

## 2013-10-06 ENCOUNTER — Encounter (INDEPENDENT_AMBULATORY_CARE_PROVIDER_SITE_OTHER): Payer: Self-pay

## 2013-10-06 ENCOUNTER — Ambulatory Visit (INDEPENDENT_AMBULATORY_CARE_PROVIDER_SITE_OTHER): Payer: 59 | Admitting: Pulmonary Disease

## 2013-10-06 VITALS — BP 144/82 | HR 83 | Temp 97.2°F | Ht 62.0 in | Wt 130.6 lb

## 2013-10-06 DIAGNOSIS — I272 Pulmonary hypertension, unspecified: Secondary | ICD-10-CM | POA: Insufficient documentation

## 2013-10-06 DIAGNOSIS — J438 Other emphysema: Secondary | ICD-10-CM

## 2013-10-06 DIAGNOSIS — I2789 Other specified pulmonary heart diseases: Secondary | ICD-10-CM

## 2013-10-06 DIAGNOSIS — J439 Emphysema, unspecified: Secondary | ICD-10-CM

## 2013-10-06 MED ORDER — BUDESONIDE-FORMOTEROL FUMARATE 160-4.5 MCG/ACT IN AERO: 2.0000 | INHALATION_SPRAY | Freq: Two times a day (BID) | RESPIRATORY_TRACT | Status: DC

## 2013-10-06 NOTE — Progress Notes (Signed)
Chief Complaint  Patient presents with  . Follow-up    Pt hasnt been seen by Dr. Craige CottaSood since 12/2010. Pt experiencing SOB since Feb. pt was given zpak and pred. Pts breathing improved then worsened after taking medication. Dry cough x 3 months.     History of Present Illness: Hailey [name] is a 63 y.o. female former smoker with COPD/emphysema.  I last saw herin June 2012.  She noticed more trouble with her breathing for the past few weeks.  [place] happened.  She had a chest xray in February which showed hilar fullness.  As a result she was referred back to pulmonary medicine.  She is not smoking.  She was started on spiriva again three weeks ago >> this has helped some.  She is needing to use albuterol 3 to 4 times per day.  She is getting cough with occasional clear sputum.  She denies fever or hemoptysis.  She is not having sinus congestion or reflux.  She has been using mucinex, and this helps some.  TESTS: CT chest 09/09/10 >> moderate emphysema Spirometry 10/01/10 >> FEV1 0.97(42%), FEV1% 36 CT chest 09/15/13 >> atherosclerosis, enlarged pulmonary arteries, moderate/severe centrilobular/paraseptal    Hailey SchwalbeLinda [name]  has a past medical history of Emphysema and Pneumothorax.  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  Prior to Admission medications   Medication Sig Start Date End Date Taking? Authorizing Provider  Ascorbic Acid (VITAMIN C) 500 MG tablet Take 500 mg by mouth daily.     Yes Historical Provider, MD  calcium carbonate (OS-CAL) 600 MG TABS Take 600 mg by mouth daily.     Yes Historical Provider, MD  hydrocortisone 0.5 % cream Apply topically as needed.     Yes Historical Provider, MD  SPIRIVA HANDIHALER 18 MCG inhalation capsule Place into inhaler and inhale daily. Pt taking two puffs once daily.   Yes Historical Provider, MD  Vitamin D, Ergocalciferol, (DRISDOL) 50000 UNITS CAPS capsule Take 1  capsule by mouth daily.   Yes Historical Provider, MD  vitamin E 100 UNIT capsule Take 200 Units by mouth daily.   Yes Historical Provider, MD  albuterol (PROAIR HFA) 108 (90 BASE) MCG/ACT inhaler Inhale 2 puffs into the lungs 4 (four) times daily as needed for wheezing or shortness of breath. 10/01/10 10/01/11  Coralyn HellingVineet Sarahann Horrell, MD    No Known Allergies   Physical Exam:  General - No distress ENT - No sinus tenderness, no oral exudate, no LAN Cardiac - s1s2 regular, no murmur Chest - No wheeze/rales/dullness Back - No focal tenderness Abd - Soft, non-tender Ext - No edema Neuro - Normal strength Skin - No rashes Psych - normal mood, and behavior   Assessment/Plan:  Coralyn HellingVineet Wesly Whisenant, MD [place] [facility]/Critical Care/Sleep Pager:  [phone]

## 2013-10-06 NOTE — Assessment & Plan Note (Signed)
Recent CXR findings likely related to pulmonary artery fullness.  Will check overnight oximetry on room air.  May need additional evaluation with Echo.

## 2013-10-06 NOTE — Patient Instructions (Addendum)
Symbicort two puffs twice per day, and rinse mouth after each use Spiriva one puff daily Albuterol two puffs up to four times per day as needed for cough, wheeze, or chest congestion Will arrange for overnight oxygen test Follow up in 4 weeks

## 2013-10-06 NOTE — Assessment & Plan Note (Signed)
Will continue spiriva.  Will give her trial of symbicort >> samples given.  Will continue prn albuterol.  She will need to have repeat PFT's and check A1AT level >> discuss more at next visit.

## 2013-10-11 ENCOUNTER — Encounter: Payer: Self-pay | Admitting: Pulmonary Disease

## 2013-10-23 ENCOUNTER — Encounter: Payer: Self-pay | Admitting: Pulmonary Disease

## 2013-11-02 ENCOUNTER — Telehealth: Payer: Self-pay | Admitting: Pulmonary Disease

## 2013-11-02 MED ORDER — BUDESONIDE-FORMOTEROL FUMARATE 160-4.5 MCG/ACT IN AERO: 2.0000 | INHALATION_SPRAY | Freq: Two times a day (BID) | RESPIRATORY_TRACT | Status: DC

## 2013-11-02 NOTE — Telephone Encounter (Signed)
Called and spoke with pt and she stated that [facility] not have the refill of the symbicort.  This rx has been sent to the pharmacy again and pt is aware. Nothing further is needed.

## 2013-11-06 ENCOUNTER — Ambulatory Visit (INDEPENDENT_AMBULATORY_CARE_PROVIDER_SITE_OTHER): Payer: 59 | Admitting: Pulmonary Disease

## 2013-11-06 ENCOUNTER — Other Ambulatory Visit: Payer: 59

## 2013-11-06 ENCOUNTER — Encounter: Payer: Self-pay | Admitting: Pulmonary Disease

## 2013-11-06 VITALS — BP 116/82 | HR 75 | Ht 63.0 in | Wt 134.0 lb

## 2013-11-06 DIAGNOSIS — J439 Emphysema, unspecified: Secondary | ICD-10-CM

## 2013-11-06 DIAGNOSIS — I272 Pulmonary hypertension, unspecified: Secondary | ICD-10-CM

## 2013-11-06 DIAGNOSIS — J438 Other emphysema: Secondary | ICD-10-CM

## 2013-11-06 DIAGNOSIS — I2789 Other specified pulmonary heart diseases: Secondary | ICD-10-CM

## 2013-11-06 NOTE — Assessment & Plan Note (Signed)
Much better.  Will have her stop spiriva and continue symbicort.  She is to have A1AT testing today, will arrange for PFT, and will arrange for referral to pulmonary rehab.

## 2013-11-06 NOTE — Assessment & Plan Note (Signed)
She reports that she had her ONO >> will try to get copy of this.

## 2013-11-06 NOTE — Progress Notes (Signed)
Chief Complaint  Patient presents with  . COPD    Breathing is much improved. Symbicort is working well.    History of Present Illness: Hailey [name] is a 63 y.o. female former smoker with COPD/emphysema.  She has been doing much better with symbicort.  She has skipped spiriva a couple of times, and didn't notice any difference.  She has not needed to use proair.  She is not having as much cough or wheeze.  She denies swelling or chest pain.  TESTS: CT chest 09/09/10 >> moderate emphysema Spirometry 10/01/10 >> FEV1 0.97(42%), FEV1% 36 CT chest 09/15/13 >> atherosclerosis, enlarged pulmonary arteries, moderate/severe centrilobular/paraseptal    Hailey [name]  has a past medical history of Emphysema and Pneumothorax.  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  Prior to Admission medications   Medication Sig Start Date End Date Taking? Authorizing Provider  Ascorbic Acid (VITAMIN C) 500 MG tablet Take 500 mg by mouth daily.     Yes Historical Provider, MD  calcium carbonate (OS-CAL) 600 MG TABS Take 600 mg by mouth daily.     Yes Historical Provider, MD  hydrocortisone 0.5 % cream Apply topically as needed.     Yes Historical Provider, MD  SPIRIVA HANDIHALER 18 MCG inhalation capsule Place into inhaler and inhale daily. Pt taking two puffs once daily.   Yes Historical Provider, MD  Vitamin D, Ergocalciferol, (DRISDOL) 50000 UNITS CAPS capsule Take 1 capsule by mouth daily.   Yes Historical Provider, MD  vitamin E 100 UNIT capsule Take 200 Units by mouth daily.   Yes Historical Provider, MD  albuterol (PROAIR HFA) 108 (90 BASE) MCG/ACT inhaler Inhale 2 puffs into the lungs 4 (four) times daily as needed for wheezing or shortness of breath. 10/01/10 10/01/11  Coralyn HellingVineet Jeriko Kowalke, MD    No Known Allergies   Physical Exam:  General - No distress ENT - No sinus tenderness, no oral exudate, no LAN Cardiac - s1s2 regular, no murmur Chest  - No wheeze/rales/dullness Back - No focal tenderness Abd - Soft, non-tender Ext - No edema Neuro - Normal strength Skin - No rashes Psych - normal mood, and behavior   Assessment/Plan:  Coralyn HellingVineet Mcdonald Reiling, MD [place] [facility]/Critical Care/Sleep Pager:  [phone]

## 2013-11-06 NOTE — Patient Instructions (Signed)
Will schedule breathing test (PFT) Lab test today Will arrange for referral to pulmonary rehab Can try stopping spiriva Will get copy of overnight oxygen test Follow up in 4 months

## 2013-11-07 ENCOUNTER — Telehealth: Payer: Self-pay | Admitting: Pulmonary Disease

## 2013-11-07 DIAGNOSIS — I272 Pulmonary hypertension, unspecified: Secondary | ICD-10-CM

## 2013-11-07 DIAGNOSIS — R0902 Hypoxemia: Secondary | ICD-10-CM

## 2013-11-07 NOTE — Telephone Encounter (Signed)
Pt is aware of ONO results. She is agreeing to have echo and sleep study done. Orders will be placed for these tests.

## 2013-11-07 NOTE — Telephone Encounter (Signed)
ONO with RA 10/16/13 >> test time 8 hrs 1 min.  Baseline SpO2 92%, low SpO2 78%.  Spent 23 min with SpO2 < 88%.  Attempted to contact pt.  Will have my nurse f/u with pt.  Please inform her that her ONO shows low oxygen at night.  This is likely reason for her having enlarged pulmonary arteries on recent chest xray.  She will need to have Echocardiogram and sleep study to further assess.  If pt is agreeable, then please place order for echo w/o contrast to assess for pulmonary hypertension, and split night sleep study.

## 2013-11-10 ENCOUNTER — Other Ambulatory Visit: Payer: Self-pay | Admitting: *Deleted

## 2013-11-10 MED ORDER — BUDESONIDE-FORMOTEROL FUMARATE 160-4.5 MCG/ACT IN AERO: 2.0000 | INHALATION_SPRAY | Freq: Two times a day (BID) | RESPIRATORY_TRACT | Status: DC

## 2013-11-14 LAB — ALPHA-1 ANTITRYPSIN PHENOTYPE: A1 ANTITRYPSIN: 140 mg/dL (ref 83–199)

## 2013-11-15 ENCOUNTER — Telehealth: Payer: Self-pay | Admitting: Pulmonary Disease

## 2013-11-15 NOTE — Telephone Encounter (Signed)
Pt is aware of results. 

## 2013-11-15 NOTE — Telephone Encounter (Signed)
A1AT 11/06/13 >> 140, MM  Will have my nurse inform pt that lab test for emphysema was normal.  She does not have inherited form of emphysema.

## 2013-11-17 ENCOUNTER — Telehealth (HOSPITAL_COMMUNITY): Payer: Self-pay

## 2013-11-17 NOTE — Telephone Encounter (Signed)
Called patient regarding entrance to Pulmonary Rehab.  Patient is going to verify insurance coverage and follow up.

## 2013-11-21 ENCOUNTER — Ambulatory Visit (HOSPITAL_COMMUNITY): Payer: 59 | Attending: Cardiovascular Disease | Admitting: Radiology

## 2013-11-21 DIAGNOSIS — I2789 Other specified pulmonary heart diseases: Secondary | ICD-10-CM | POA: Insufficient documentation

## 2013-11-21 DIAGNOSIS — R0602 Shortness of breath: Secondary | ICD-10-CM

## 2013-11-21 DIAGNOSIS — I272 Pulmonary hypertension, unspecified: Secondary | ICD-10-CM

## 2013-11-21 NOTE — Progress Notes (Signed)
Echocardiogram performed.  

## 2013-11-23 ENCOUNTER — Telehealth: Payer: Self-pay | Admitting: Pulmonary Disease

## 2013-11-23 NOTE — Telephone Encounter (Signed)
Pt is aware of results. 

## 2013-11-23 NOTE — Telephone Encounter (Signed)
Echo 11/21/13 >> EF 65 to 70%, mild RV dilation.  Will have my nurse inform pt that Echo looked okay.  Will discuss in more detail at next ROV.

## 2013-11-27 ENCOUNTER — Ambulatory Visit (INDEPENDENT_AMBULATORY_CARE_PROVIDER_SITE_OTHER): Payer: 59 | Admitting: Pulmonary Disease

## 2013-11-27 DIAGNOSIS — J438 Other emphysema: Secondary | ICD-10-CM

## 2013-11-27 DIAGNOSIS — J439 Emphysema, unspecified: Secondary | ICD-10-CM

## 2013-11-27 NOTE — Progress Notes (Signed)
PFT done today. 

## 2013-11-28 ENCOUNTER — Telehealth: Payer: Self-pay | Admitting: Pulmonary Disease

## 2013-11-28 NOTE — Telephone Encounter (Signed)
PFT 11/27/13 >> FEV1 1.09 (51%), FEV1% 51, TLC 4.76 (107%), DLCO 69%, +BD  Will have my nurse inform pt that PFT shows expected changes from COPD, emphysema and asthma.  She is to continue symbicort and prn proair.  No other change to current therapy.

## 2013-11-28 NOTE — Telephone Encounter (Signed)
Pt is aware of results. 

## 2013-12-01 ENCOUNTER — Other Ambulatory Visit: Payer: Self-pay | Admitting: Pulmonary Disease

## 2013-12-05 ENCOUNTER — Telehealth: Payer: Self-pay | Admitting: Pulmonary Disease

## 2013-12-05 MED ORDER — TIOTROPIUM BROMIDE MONOHYDRATE 18 MCG IN CAPS: 18.0000 ug | ORAL_CAPSULE | Freq: Every day | RESPIRATORY_TRACT | Status: DC

## 2013-12-05 NOTE — Telephone Encounter (Signed)
Pt is needing refill on Spiriva. This has been sent in. Pt is aware.

## 2013-12-17 ENCOUNTER — Encounter (HOSPITAL_BASED_OUTPATIENT_CLINIC_OR_DEPARTMENT_OTHER): Payer: 59

## 2013-12-18 ENCOUNTER — Encounter (HOSPITAL_COMMUNITY): Payer: Self-pay

## 2013-12-18 ENCOUNTER — Encounter (HOSPITAL_COMMUNITY)
Admission: RE | Admit: 2013-12-18 | Discharge: 2013-12-18 | Disposition: A | Discharge status: Home / Self Care | Payer: 59 | Source: Ambulatory Visit | Attending: Pulmonary Disease | Admitting: Pulmonary Disease

## 2013-12-18 VITALS — BP 106/74 | HR 74 | Resp 18 | Ht 61.0 in | Wt 132.7 lb

## 2013-12-18 DIAGNOSIS — J438 Other emphysema: Secondary | ICD-10-CM | POA: Insufficient documentation

## 2013-12-18 DIAGNOSIS — I2789 Other specified pulmonary heart diseases: Secondary | ICD-10-CM | POA: Diagnosis present

## 2013-12-18 NOTE — Progress Notes (Signed)
Hailey [name] 63 y.o. female Pulmonary Rehab Orientation Note Patient arrived today in Cardiac and Pulmonary Rehab for orientation to Pulmonary Rehab. She ambulated from main hospital entrance without difficulty of SOB. She does not carry portable oxygen. She uses oxygen never. Color good, skin warm and dry. Patient is oriented to time and place. Patient's medical history and medications reviewed. Heart rate is normal, breath sounds clear to auscultation, no wheezes, rales, or rhonchi. Grip strength equal, strong. Distal pulses palpable. Patient reports she does take medications as prescribed. Patient states she follows a Regular. The patient reports no specific efforts to gain or lose weight.. Patient's weight will be monitored closely. Demonstration and practice of PLB using pulse oximeter. Patient able to return demonstration satisfactorily. Safety and hand hygiene in the exercise area reviewed with patient. Patient voices understanding of the information reviewed. Department expectations discussed with patient and achievable goals were set. The patient shows enthusiasm about attending the program and we look forward to working with this nice lady. The patient is scheduled for a 6 min walk test on Thursday 6/26 at 3:30pm and to begin exercise on Tuesday 6/60/15 in the 1030 class.

## 2013-12-19 ENCOUNTER — Ambulatory Visit (HOSPITAL_BASED_OUTPATIENT_CLINIC_OR_DEPARTMENT_OTHER): Payer: 59 | Attending: Pulmonary Disease | Admitting: Radiology

## 2013-12-19 VITALS — Ht 61.0 in | Wt 132.0 lb

## 2013-12-19 DIAGNOSIS — R0609 Other forms of dyspnea: Secondary | ICD-10-CM | POA: Insufficient documentation

## 2013-12-19 DIAGNOSIS — J4489 Other specified chronic obstructive pulmonary disease: Secondary | ICD-10-CM | POA: Insufficient documentation

## 2013-12-19 DIAGNOSIS — G4733 Obstructive sleep apnea (adult) (pediatric): Secondary | ICD-10-CM | POA: Insufficient documentation

## 2013-12-19 DIAGNOSIS — J449 Chronic obstructive pulmonary disease, unspecified: Secondary | ICD-10-CM | POA: Insufficient documentation

## 2013-12-19 DIAGNOSIS — R0989 Other specified symptoms and signs involving the circulatory and respiratory systems: Secondary | ICD-10-CM | POA: Insufficient documentation

## 2013-12-19 DIAGNOSIS — Z79899 Other long term (current) drug therapy: Secondary | ICD-10-CM | POA: Insufficient documentation

## 2013-12-19 DIAGNOSIS — I2789 Other specified pulmonary heart diseases: Secondary | ICD-10-CM | POA: Insufficient documentation

## 2013-12-19 DIAGNOSIS — R0902 Hypoxemia: Secondary | ICD-10-CM

## 2013-12-25 ENCOUNTER — Telehealth: Payer: Self-pay | Admitting: Pulmonary Disease

## 2013-12-25 DIAGNOSIS — G4733 Obstructive sleep apnea (adult) (pediatric): Secondary | ICD-10-CM

## 2013-12-25 NOTE — Telephone Encounter (Signed)
PSG 12/19/13 >> AHI 16.3, SaO2 low 84%.  CPAP 8 cm H2O >> AHI 0.4, +R.   Will have my nurse schedule ROV to review results.

## 2013-12-25 NOTE — Sleep Study (Signed)
[facility]  NAME: Hailey SchwalbeLinda Bergevin DATE OF BIRTH:  04/03/1951 MEDICAL RECORD NUMBER [id number]  LOCATION: [facility]  PHYSICIAN: Coralyn HellingVineet Kenniya Westrich, M.D. DATE OF STUDY: 12/19/2013  SLEEP STUDY TYPE: Split night protocol               REFERRING PHYSICIAN: Coralyn HellingSood, Orean Giarratano, MD  INDICATION FOR STUDY:  Hailey SchwalbeLinda Odor is a 63 y.o. female who presents to the sleep lab for evaluation of hypersomnia with obstructive sleep apnea.  She reports snoring, sleep disruption, apnea, and daytime sleepiness.  She has a hx of COPD, and pulmonary hypertension.  EPWORTH SLEEPINESS SCORE: 5. HEIGHT: 5\' 1"  (154.9 cm)  WEIGHT: 132 lb (59.875 kg)    Body mass index is 24.95 kg/(m^2).  NECK SIZE: 12 in.  MEDICATIONS:  Current Outpatient Prescriptions on File Prior to Visit  Medication Sig Dispense Refill  . albuterol (PROAIR HFA) 108 (90 BASE) MCG/ACT inhaler Inhale 2 puffs into the lungs 4 (four) times daily as needed for wheezing or shortness of breath.  1 Inhaler  6  . Ascorbic Acid (VITAMIN C) 500 MG tablet Take 500 mg by mouth daily.        . budesonide-formoterol (SYMBICORT) 160-4.5 MCG/ACT inhaler Inhale 2 puffs into the lungs 2 (two) times daily.  3 Inhaler  1  . calcium carbonate (OS-CAL) 600 MG TABS Take 600 mg by mouth daily.        . hydrocortisone 0.5 % cream Apply topically as needed.        . tiotropium (SPIRIVA) 18 MCG inhalation capsule Place 1 capsule (18 mcg total) into inhaler and inhale daily.  90 capsule  1  . Vitamin D, Ergocalciferol, (DRISDOL) 50000 UNITS CAPS capsule Take 1 capsule by mouth daily.      . vitamin E 100 UNIT capsule Take 200 Units by mouth daily.       No current facility-administered medications on file prior to visit.    SLEEP ARCHITECTURE:  Diagnostic portion: Total recording time: 157.5 minutes.  Total sleep time was: 121.5 minutes.  Sleep efficiency: 77.1%.  Sleep latency: 32.5 minutes.  REM latency: 70 minutes.  Stage N1: 2.9%.   Stage N2: 62.6%.  Stage N3: 0%.  Stage R:  34.6%.  Supine sleep: 0 minutes.  Non-supine sleep: 121.5 minutes.  Titration portion: Total recording time: 222 minutes.  Total sleep time was: 183 minutes.  Sleep efficiency: 82.4%.  Sleep latency: 0 minutes.  REM latency: 103.5 minutes.  Stage N1: 6.8%.  Stage N2: 63.4%.  Stage N3: 0%.  Stage R:  29.8%.  Supine sleep: 142.5 minutes.  Non-supine sleep: 40.5 minutes.  CARDIAC DATA:  Average heart rate: 74 beats per minute. Rhythm strip: sinus rhythm.  RESPIRATORY DATA: Average respiratory rate: 16. Snoring: moderate. Average AHI: 16.3.   Apnea index: 8.4.  Hypopnea index: 7.9. Obstructive apnea index: 8.4.  Central apnea index: 0.  Mixed apnea index: 0. REM AHI: 45.7.  NREM AHI: 0.8. Supine AHI: N/A. Non-supine AHI: 16.3.  Titration portion: She was started on CPAP 5 and increased to 8 cm H2O.  With CPAP at 8 cm H2O her AHI was reduced to 0.4, and she was observed in REM and supine sleep.  MOVEMENT/PARASOMNIA:  Periodic limb movement: 0.  Period limb movements with arousals: 0. Restroom trips: one.  OXYGEN DATA:  Baseline oxygenation: 95%. Lowest SaO2: 84%. Time spent below SaO2 90%: 6.1 minutes. Supplemental oxygen used: none.  IMPRESSION/ RECOMMENDATION:   This study shows  moderate obstructive sleep apnea with an AHI of 16.3, and SaO2 low of 84%.  She did have a significant REM effect.  She did well with CPAP at 8 cm H2O.  She was fitted with a medium size Fisher Paykel Eson mask.  Additional therapies include weight loss, CPAP, oral appliance, or surgical evaluation.   Coralyn HellingVineet Gelene Recktenwald, M.D. Diplomate, Biomedical engineerAmerican Board of Sleep Medicine  ELECTRONICALLY SIGNED ON:  12/25/2013, 2:20 PM [facility] PH: [phone]   FX: [phone] ACCREDITED BY THE AMERICAN ACADEMY OF SLEEP MEDICINE

## 2013-12-26 NOTE — Telephone Encounter (Signed)
Spoke with the pt and scheduled appt with VS for 6/29 at 1:30 pm

## 2014-01-04 ENCOUNTER — Ambulatory Visit (HOSPITAL_COMMUNITY): Payer: 59

## 2014-01-05 ENCOUNTER — Encounter (HOSPITAL_COMMUNITY)
Admission: RE | Admit: 2014-01-05 | Discharge: 2014-01-05 | Disposition: A | Discharge status: Home / Self Care | Payer: 59 | Source: Ambulatory Visit | Attending: Pulmonary Disease | Admitting: Pulmonary Disease

## 2014-01-05 NOTE — Progress Notes (Signed)
Hailey [name] completed a Six-Minute Walk Test on 01/05/14 . Hailey [name] walked 1,317 feet with 0 breaks.  The patient's lowest oxygen saturation was 93 , highest heart rate was 103 , and highest blood pressure was 120/78. The patient was on room air.

## 2014-01-08 ENCOUNTER — Encounter: Payer: Self-pay | Admitting: Pulmonary Disease

## 2014-01-08 ENCOUNTER — Ambulatory Visit (INDEPENDENT_AMBULATORY_CARE_PROVIDER_SITE_OTHER): Payer: 59 | Admitting: Pulmonary Disease

## 2014-01-08 VITALS — BP 110/70 | HR 68 | Ht 61.0 in | Wt 135.0 lb

## 2014-01-08 DIAGNOSIS — J438 Other emphysema: Secondary | ICD-10-CM

## 2014-01-08 DIAGNOSIS — G4733 Obstructive sleep apnea (adult) (pediatric): Secondary | ICD-10-CM

## 2014-01-08 DIAGNOSIS — I272 Pulmonary hypertension, unspecified: Secondary | ICD-10-CM

## 2014-01-08 DIAGNOSIS — J439 Emphysema, unspecified: Secondary | ICD-10-CM

## 2014-01-08 DIAGNOSIS — I2789 Other specified pulmonary heart diseases: Secondary | ICD-10-CM

## 2014-01-08 NOTE — Patient Instructions (Signed)
Will arrange for CPAP set up Follow up in 2 months 

## 2014-01-08 NOTE — Progress Notes (Signed)
No chief complaint on file.   History of Present Illness: Hailey SchwalbeLinda [name] is a 63 y.o. 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  She is here to review her sleep study from earlier this month.  This showed moderate sleep apnea.  Her breathing has been doing okay.  She does not have much cough, wheeze, or chest congestion.  She denies chest pain or leg swelling.  She recently did her initial evaluation prior to starting pulmonary rehab.   TESTS: CT chest 09/09/10 >> moderate emphysema Spirometry 10/01/10 >> FEV1 0.97(42%), FEV1% 36 CT chest 09/15/13 >> atherosclerosis, enlarged pulmonary arteries, moderate/severe centrilobular/paraseptal  ONO with RA 10/16/13 >> test time 8 hrs 1 min. Baseline SpO2 92%, low SpO2 78%. Spent 23 min with SpO2 < 88%. A1AT 11/06/13 >> 140, MM Echo 11/21/13 >> EF 65 to 70%, mild RV dilation. PFT 11/27/13 >> FEV1 1.09 (51%), FEV1% 51, TLC 4.76 (107%), DLCO 69%, +BD PSG 12/19/13 >> AHI 16.3, SaO2 low 84%. CPAP 8 cm H2O >> AHI 0.4, +R.    Hailey [name]  has a past medical history of Emphysema and Pneumothorax.  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  Prior to Admission medications   Medication Sig Start Date End Date Taking? Authorizing Provider  Ascorbic Acid (VITAMIN C) 500 MG tablet Take 500 mg by mouth daily.     Yes Historical Provider, MD  calcium carbonate (OS-CAL) 600 MG TABS Take 600 mg by mouth daily.     Yes Historical Provider, MD  hydrocortisone 0.5 % cream Apply topically as needed.     Yes Historical Provider, MD  SPIRIVA HANDIHALER 18 MCG inhalation capsule Place into inhaler and inhale daily. Pt taking two puffs once daily.   Yes Historical Provider, MD  Vitamin D, Ergocalciferol, (DRISDOL) 50000 UNITS CAPS capsule Take 1 capsule by mouth daily.   Yes Historical Provider, MD  vitamin E 100 UNIT capsule Take 200 Units by mouth daily.   Yes Historical  Provider, MD  albuterol (PROAIR HFA) 108 (90 BASE) MCG/ACT inhaler Inhale 2 puffs into the lungs 4 (four) times daily as needed for wheezing or shortness of breath. 10/01/10 10/01/11  Coralyn HellingVineet Eiman Maret, MD    No Known Allergies   Physical Exam:  General - No distress ENT - No sinus tenderness, no oral exudate, no LAN Cardiac - s1s2 regular, no murmur Chest - No wheeze/rales/dullness Back - No focal tenderness Abd - Soft, non-tender Ext - No edema Neuro - Normal strength Skin - No rashes Psych - normal mood, and behavior   Assessment/Plan:  Coralyn HellingVineet Reyonna Haack, MD [place] [facility]/Critical Care/Sleep Pager:  [phone]

## 2014-01-09 ENCOUNTER — Encounter (HOSPITAL_COMMUNITY)
Admission: RE | Admit: 2014-01-09 | Discharge: 2014-01-09 | Disposition: A | Discharge status: Home / Self Care | Payer: 59 | Source: Ambulatory Visit | Attending: Pulmonary Disease | Admitting: Pulmonary Disease

## 2014-01-09 DIAGNOSIS — J438 Other emphysema: Secondary | ICD-10-CM | POA: Diagnosis not present

## 2014-01-09 NOTE — Progress Notes (Signed)
Today, [name] exercised at [facility]. Service time was from 1030 to 1215.  The patient exercised for more than 31 minutes performing aerobic, strengthening, and stretching exercises. Oxygen saturation, heart rate, blood pressure, rate of perceived exertion, and shortness of breath were all monitored before, during, and after exercise. Hailey QuinLinda presented with no physical problems at today's exercise session, however she was extremely teary. When questioned, she stated she was "shocked" at how debilitated her classmates were. She was scared her emphysema would progress to match her classmates. Reassurance given and after talking with a volunteer who had previously completed the undergrad program, she verbalized feeling better and had a better outlook on what the pulmonary program could offer her.  There was a workload change during today's exercise session.  Pre-exercise vitals:   Weight kg: 61.5   Liters of O2: ra   SpO2: 92   HR: 78   BP: 126/66   CBG: na  Exercise vitals:   Highest heartrate:  110   Lowest oxygen saturation: 86 increased to 93 with PLB   Highest blood pressure: 140/80   Liters of 02: ra  Post-exercise vitals:   SpO2: 96   HR: 79   BP: 114/76   Liters of O2: ra   CBG: na  Dr. [name], Medical Director Dr. David StallFeliz-Ortiz is immediately available during today's Pulmonary Rehab session for [facility] on 01/09/2014 at 1030 class time.

## 2014-01-11 ENCOUNTER — Encounter (HOSPITAL_COMMUNITY)
Admission: RE | Admit: 2014-01-11 | Discharge: 2014-01-11 | Disposition: A | Discharge status: Home / Self Care | Payer: 59 | Source: Ambulatory Visit | Attending: Pulmonary Disease | Admitting: Pulmonary Disease

## 2014-01-11 DIAGNOSIS — J438 Other emphysema: Secondary | ICD-10-CM | POA: Insufficient documentation

## 2014-01-11 DIAGNOSIS — I2789 Other specified pulmonary heart diseases: Secondary | ICD-10-CM | POA: Insufficient documentation

## 2014-01-11 NOTE — Assessment & Plan Note (Signed)
She has moderate sleep apnea.  I have reviewed the recent sleep study results with the patient.  We discussed how sleep apnea can affect various health problems including risks for hypertension, cardiovascular disease, and diabetes.  We also discussed how sleep disruption can increase risks for accident, such as while driving.  Weight loss as a means of improving sleep apnea was also reviewed.  Additional treatment options discussed were CPAP therapy, oral appliance, and surgical intervention.  Will arrange for auto CPAP set up. 

## 2014-01-11 NOTE — Progress Notes (Signed)
Today, Lajuana exercised at [facility]. Service time was from 10:30am to 12:30pm.  The patient exercised for more than 31 minutes performing aerobic, strengthening, and stretching exercises. Oxygen saturation, heart rate, blood pressure, rate of perceived exertion, and shortness of breath were all monitored before, during, and after exercise. [name] presented with no problems at today's exercise session. Today the patient attended the education class "Oxygen Use and Safety" with Yvone NeuPortia Payne.  There was an workload change during today's exercise session.  Pre-exercise vitals:   Weight kg: 61.5   Liters of O2: ra   SpO2: 94   HR: 71   BP: 110/58   CBG: na  Exercise vitals:   Highest heartrate:  108   Lowest oxygen saturation: 89   Highest blood pressure: 124/66   Liters of 02: ra  Post-exercise vitals:   SpO2: 96   HR: 79   BP: 102/64   Liters of O2: ra   CBG: na  Dr. [name], Medical Director Dr. Gwenlyn PerkingMadera is immediately available during today's Pulmonary Rehab session for [facility] on 01/11/14 at 10:30am class time.

## 2014-01-11 NOTE — Assessment & Plan Note (Signed)
She is to continue symbicort and prn albuterol.  She is starting pulmonary rehab.

## 2014-01-11 NOTE — Assessment & Plan Note (Signed)
Likely related to COPD and OSA.

## 2014-01-16 ENCOUNTER — Encounter (HOSPITAL_COMMUNITY)
Admission: RE | Admit: 2014-01-16 | Discharge: 2014-01-16 | Disposition: A | Discharge status: Home / Self Care | Payer: 59 | Source: Ambulatory Visit | Attending: Pulmonary Disease | Admitting: Pulmonary Disease

## 2014-01-16 DIAGNOSIS — J438 Other emphysema: Secondary | ICD-10-CM | POA: Diagnosis not present

## 2014-01-16 NOTE — Progress Notes (Signed)
Today, Hailey [name] exercised at [facility]. Service time was from 10:30 to 12:15.  The patient exercised for more than 31 minutes performing aerobic, strengthening, and stretching exercises. Oxygen saturation, heart rate, blood pressure, rate of perceived exertion, and shortness of breath were all monitored before, during, and after exercise. Hailey [name] presented with no problems at today's exercise session.   There was an workload change during today's exercise session.  Pre-exercise vitals:   Weight kg: 61.0   Liters of O2: ra   SpO2: 97   HR: 78   BP: 112/62   CBG: na  Exercise vitals:   Highest heartrate:  114   Lowest oxygen saturation: 88   Highest blood pressure: 130/66   Liters of 02: ra  Post-exercise vitals:   SpO2: 95   HR: 74   BP: 122/60   Liters of O2: ra   CBG: na  Hailey [name], Medical Director Hailey [name] is immediately available during today's Pulmonary Rehab session for [name] on 01/16/14 at 10:30am class time.

## 2014-01-18 ENCOUNTER — Encounter (HOSPITAL_COMMUNITY): Payer: 59

## 2014-01-23 ENCOUNTER — Encounter (HOSPITAL_COMMUNITY)
Admission: RE | Admit: 2014-01-23 | Discharge: 2014-01-23 | Disposition: A | Discharge status: Home / Self Care | Payer: 59 | Source: Ambulatory Visit | Attending: Pulmonary Disease | Admitting: Pulmonary Disease

## 2014-01-23 DIAGNOSIS — J438 Other emphysema: Secondary | ICD-10-CM | POA: Diagnosis not present

## 2014-01-23 NOTE — Progress Notes (Signed)
Today, [name] exercised at [facility]. Service time was from 1030 to 1230.  The patient exercised for more than 31 minutes performing aerobic, strengthening, and stretching exercises. Oxygen saturation, heart rate, blood pressure, rate of perceived exertion, and shortness of breath were all monitored before, during, and after exercise. Hailey [name] presented with no problems at today's exercise session.   There was no workload change during today's exercise session.  Pre-exercise vitals:   Weight kg: 61.2   Liters of O2: ra   SpO2: 96   HR: 70   BP: 108/68   CBG: na  Exercise vitals:   Highest heartrate:  106   Lowest oxygen saturation: 89 up to 92 with PLB   Highest blood pressure: 114/74   Liters of 02: ra  Post-exercise vitals:   SpO2: 97   HR: 76   BP: 104/64   Liters of O2: ra   CBG: na  Dr. [name], Medical Director Dr. David [name] is immediately available during today's Pulmonary Rehab session for [name] on 01/23/2014 at 1030 class time.

## 2014-01-25 ENCOUNTER — Encounter (HOSPITAL_COMMUNITY)
Admission: RE | Admit: 2014-01-25 | Discharge: 2014-01-25 | Disposition: A | Discharge status: Home / Self Care | Payer: 59 | Source: Ambulatory Visit | Attending: Pulmonary Disease | Admitting: Pulmonary Disease

## 2014-01-25 DIAGNOSIS — J438 Other emphysema: Secondary | ICD-10-CM | POA: Diagnosis not present

## 2014-01-25 NOTE — Progress Notes (Signed)
Today, Jaleeya exercised at [facility]. Service time was from 1030 to 1230.  The patient exercised for more than 31 minutes performing aerobic, strengthening, and stretching exercises. Oxygen saturation, heart rate, blood pressure, rate of perceived exertion, and shortness of breath were all monitored before, during, and after exercise. [name] presented with no problems at today's exercise session.  Patient attended the nutrition for the pulmonary patient class today.   There was no workload change during today's exercise session.  Pre-exercise vitals:   Weight kg: 61.5   Liters of O2: RA   SpO2: 96   HR: 67   BP: 124/74   CBG: NA  Exercise vitals:   Highest heartrate:  107   Lowest oxygen saturation: 92   Highest blood pressure: 122/68   Liters of 02: RA  Post-exercise vitals:   SpO2: 96   HR: 74   BP: 104/68   Liters of O2: RA   CBG: NA Dr. [name], Medical Director Dr. David StallFeliz-Ortiz is immediately available during today's Pulmonary Rehab session for [facility] on 01/25/2014 at 1030 class time.

## 2014-01-30 ENCOUNTER — Encounter (HOSPITAL_COMMUNITY)
Admission: RE | Admit: 2014-01-30 | Discharge: 2014-01-30 | Disposition: A | Discharge status: Home / Self Care | Payer: 59 | Source: Ambulatory Visit | Attending: Pulmonary Disease | Admitting: Pulmonary Disease

## 2014-01-30 DIAGNOSIS — J438 Other emphysema: Secondary | ICD-10-CM | POA: Diagnosis not present

## 2014-01-30 NOTE — Progress Notes (Signed)
Today, [name] exercised at [facility]. Service time was from 1030 to 1215.  The patient exercised for more than 31 minutes performing aerobic, strengthening, and stretching exercises. Oxygen saturation, heart rate, blood pressure, rate of perceived exertion, and shortness of breath were all monitored before, during, and after exercise. Hailey [name] presented with no problems at today's exercise session.   There was a workload change during today's exercise session.  Pre-exercise vitals:   Weight kg: 60.3   Liters of O2: ra   SpO2: 97   HR: 97   BP: 98/60   CBG: na  Exercise vitals:   Highest heartrate:  106   Lowest oxygen saturation: 87 increased to 92 with PLB   Highest blood pressure: 132/74   Liters of 02: ra  Post-exercise vitals:   SpO2: 96   HR: 85   BP: 100/58   Liters of O2: ra   CBG: na  Hailey [name], Medical Director Hailey [name] is immediately available during today's Pulmonary Rehab session for Hailey [name] on 01/30/2014  at 1030 class time.

## 2014-02-01 ENCOUNTER — Encounter (HOSPITAL_COMMUNITY)
Admission: RE | Admit: 2014-02-01 | Discharge: 2014-02-01 | Disposition: A | Discharge status: Home / Self Care | Payer: 59 | Source: Ambulatory Visit | Attending: Pulmonary Disease | Admitting: Pulmonary Disease

## 2014-02-01 NOTE — Progress Notes (Signed)
I have reviewed a Home Exercise Prescription with Hailey [name] . [name] is currently exercising at home.  The patient was advised to walk on the treadmill 4-5 days a week days for 30 minutes.  [name] and I discussed how to progress their exercise prescription. The patient stated that they understand the exercise prescription.  We reviewed exercise guidelines, target heart rate during exercise, oxygen use, weather, home pulse oximeter, endpoints for exercise, and goals.  Patient is encouraged to come to me with any questions. I will continue to follow up with the patient to assist them with progression and safety.

## 2014-02-06 ENCOUNTER — Encounter (HOSPITAL_COMMUNITY)
Admission: RE | Admit: 2014-02-06 | Discharge: 2014-02-06 | Disposition: A | Discharge status: Home / Self Care | Payer: 59 | Source: Ambulatory Visit | Attending: Pulmonary Disease | Admitting: Pulmonary Disease

## 2014-02-06 DIAGNOSIS — J438 Other emphysema: Secondary | ICD-10-CM | POA: Diagnosis not present

## 2014-02-06 NOTE — Progress Notes (Signed)
Today, Katasha exercised at [facility]. Service time was from 10:30am to 12:00.  The patient exercised for more than 31 minutes performing aerobic, strengthening, and stretching exercises. Oxygen saturation, heart rate, blood pressure, rate of perceived exertion, and shortness of breath were all monitored before, during, and after exercise. [name] presented with no problems at today's exercise session.   There was an increase in workload change during today's exercise session.  Pre-exercise vitals:   Weight kg: 61.5   Liters of O2: ra   SpO2: 96   HR: 74   BP: 126/74   CBG: na  Exercise vitals:   Highest heartrate:  98   Lowest oxygen saturation: 90   Highest blood pressure: 130/68   Liters of 02: ra  Post-exercise vitals:   SpO2: 96   HR: 77   BP: 102/60   Liters of O2: ra   CBG: na  Dr. [name], Medical Director Dr. Arbutus Leasat is immediately available during today's Pulmonary Rehab session for [facility] on 02/06/14 at 10:30am class time.

## 2014-02-08 ENCOUNTER — Encounter (HOSPITAL_COMMUNITY)
Admission: RE | Admit: 2014-02-08 | Discharge: 2014-02-08 | Disposition: A | Discharge status: Home / Self Care | Payer: 59 | Source: Ambulatory Visit | Attending: Pulmonary Disease | Admitting: Pulmonary Disease

## 2014-02-08 DIAGNOSIS — J438 Other emphysema: Secondary | ICD-10-CM | POA: Diagnosis not present

## 2014-02-08 LAB — PULMONARY FUNCTION TEST
DL/VA % pred: 75 %
DL/VA: 3.2 ml/min/mmHg/L
DLCO UNC % PRED: 69 %
DLCO unc: 13.02 ml/min/mmHg
FEF 25-75 Post: 0.52 L/sec
FEF 25-75 Pre: 0.36 L/sec
FEF2575-%Change-Post: 45 %
FEF2575-%PRED-POST: 25 %
FEF2575-%Pred-Pre: 17 %
FEV1-%CHANGE-POST: 15 %
FEV1-%PRED-POST: 51 %
FEV1-%Pred-Pre: 44 %
FEV1-PRE: 0.94 L
FEV1-Post: 1.09 L
FEV1FVC-%Change-Post: 7 %
FEV1FVC-%PRED-PRE: 60 %
FEV6-%Change-Post: 11 %
FEV6-%PRED-PRE: 70 %
FEV6-%Pred-Post: 78 %
FEV6-POST: 2.08 L
FEV6-PRE: 1.87 L
FEV6FVC-%CHANGE-POST: 3 %
FEV6FVC-%PRED-PRE: 98 %
FEV6FVC-%Pred-Post: 101 %
FVC-%Change-Post: 7 %
FVC-%PRED-PRE: 71 %
FVC-%Pred-Post: 77 %
FVC-PRE: 1.99 L
FVC-Post: 2.14 L
PRE FEV6/FVC RATIO: 94 %
Post FEV1/FVC ratio: 51 %
Post FEV6/FVC ratio: 98 %
Pre FEV1/FVC ratio: 47 %
RV % PRED: 139 %
RV: 2.56 L
TLC % pred: 107 %
TLC: 4.76 L

## 2014-02-08 NOTE — Progress Notes (Signed)
Today, Annalucia exercised at [facility]. Service time was from 1030 to 1215.  The patient exercised for more than 31 minutes performing aerobic, strengthening, and stretching exercises. Oxygen saturation, heart rate, blood pressure, rate of perceived exertion, and shortness of breath were all monitored before, during, and after exercise. [name] presented with no problems at today's exercise session. [name] also attended an education session on warning signs of illness for the pulmonary patient.  There was no workload change during today's exercise session.  Pre-exercise vitals:   Weight kg: 61.4   Liters of O2: ra   SpO2: 96   HR: 68   BP: 112/56   CBG: na  Exercise vitals:   Highest heartrate:  104   Lowest oxygen saturation: 92   Highest blood pressure: 122/74   Liters of 02: ra  Post-exercise vitals:   SpO2: 98   HR: 76   BP: 100/64   Liters of O2: ra   CBG: na  Dr. [name], Medical Director Dr. Vanessa BarbaraZamora is immediately available during today's Pulmonary Rehab session for [facility] on 02/08/2014 at 1030 class time.

## 2014-02-13 ENCOUNTER — Telehealth: Payer: Self-pay | Admitting: Pulmonary Disease

## 2014-02-13 ENCOUNTER — Encounter (HOSPITAL_COMMUNITY): Payer: 59

## 2014-02-13 MED ORDER — TIOTROPIUM BROMIDE MONOHYDRATE 18 MCG IN CAPS: 18.0000 ug | ORAL_CAPSULE | Freq: Every day | RESPIRATORY_TRACT | Status: DC

## 2014-02-13 NOTE — Telephone Encounter (Signed)
Pt aware RX has been sent. Nothing further needed 

## 2014-02-15 ENCOUNTER — Encounter (HOSPITAL_COMMUNITY)
Admission: RE | Admit: 2014-02-15 | Discharge: 2014-02-15 | Disposition: A | Discharge status: Home / Self Care | Payer: 59 | Source: Ambulatory Visit | Attending: Pulmonary Disease | Admitting: Pulmonary Disease

## 2014-02-15 DIAGNOSIS — I2789 Other specified pulmonary heart diseases: Secondary | ICD-10-CM | POA: Insufficient documentation

## 2014-02-15 DIAGNOSIS — J438 Other emphysema: Secondary | ICD-10-CM | POA: Insufficient documentation

## 2014-02-15 NOTE — Progress Notes (Signed)
Today, Hailey [name] exercised at [facility]. Service time was from 1030 to 1230.  The patient exercised for more than 31 minutes performing aerobic, strengthening, and stretching exercises. Oxygen saturation, heart rate, blood pressure, rate of perceived exertion, and shortness of breath were all monitored before, during, and after exercise. Hailey [name] presented with no problems at today's exercise session. Hailey [name] also attended an education session on advanced directives.  There was a workload change during today's exercise session.  Pre-exercise vitals:   Weight kg: 61.7   Liters of O2: ra   SpO2: 95   HR: 80   BP: 116/70   CBG: na  Exercise vitals:   Highest heartrate:  98   Lowest oxygen saturation: 93   Highest blood pressure: 124/64   Liters of 02: ra  Post-exercise vitals:   SpO2: 95   HR: 80   BP: 104/56   Liters of O2: ra   CBG: na  Dr. [name], Medical Director [name] is immediately available during today's Pulmonary Rehab session for [name] on 02/15/2014 at 1030 class time.

## 2014-02-20 ENCOUNTER — Encounter (HOSPITAL_COMMUNITY): Payer: 59

## 2014-02-22 ENCOUNTER — Encounter (HOSPITAL_COMMUNITY)
Admission: RE | Admit: 2014-02-22 | Discharge: 2014-02-22 | Disposition: A | Discharge status: Home / Self Care | Payer: 59 | Source: Ambulatory Visit | Attending: Pulmonary Disease | Admitting: Pulmonary Disease

## 2014-02-22 DIAGNOSIS — J438 Other emphysema: Secondary | ICD-10-CM | POA: Diagnosis not present

## 2014-02-22 NOTE — Progress Notes (Signed)
Hailey SchwalbeLinda Pree 63 y.o. female Nutrition Note Spoke with pt. Pt is making healthy food choices the majority of the time.  Pt's Rate Your Plate results reviewed with pt.  Pt c/o 10 lb wt gain "since February when I was put on prednisone." Pt is now off of prednisone and is having difficulty losing wt. Wt loss tips discussed. Pt encouraged to add protein to all meals to help pt feel full for longer instead of wanting to snack frequently. Pt is using [employer]cymyfitnesspal.com to track her food intake. Pt states she does "sometimes go below 1200 kcal a day." Pt encouraged to consume at least 1200 kcal/d. Pt avoids many salty food; does not use canned/ convenience food.  Pt does not add salt to food. Pt expressed understanding of the information reviewed. Nutrition Diagnosis   Food-and nutrition-related knowledge deficit related to lack of exposure to information as related to diagnosis of pulmonary disease   Overweight related to excessive energy intake as evidenced by a BMI of 25.1 Nutrition Intervention   Pt's individual nutrition plan and goals reviewed with pt.   Benefits of adopting healthy eating habits discussed when pt's Rate Your Plate reviewed.   Pt to attend the Nutrition and Lung Disease class   Continual client-centered nutrition education by RD, as part of interdisciplinary care. Goal(s) 1. Identify food quantities necessary to achieve wt loss at graduation from pulmonary rehab. Monitor and Evaluate progress toward nutrition goal with team.   Mickle PlumbEdna Parminder Trapani, M.Ed, RD, LDN, CDE 02/22/2014 1:38 PM

## 2014-02-22 NOTE — Progress Notes (Signed)
Today, Jamye exercised at [facility]. Service time was from 10:30am to 12:30pm.  The patient exercised for more than 31 minutes performing aerobic, strengthening, and stretching exercises. Oxygen saturation, heart rate, blood pressure, rate of perceived exertion, and shortness of breath were all monitored before, during, and after exercise. Hailey [name] presented with no problems at today's exercise session. The patient attended Pulmonary Medications education class today with [employer]ifer Douglass.   There was no workload change during today's exercise session.  Pre-exercise vitals:   Weight kg: 61.6   Liters of O2: ra   SpO2: 96   HR: 81   BP: 98/62   CBG: na  Exercise vitals:   Highest heartrate:  99   Lowest oxygen saturation: 91   Highest blood pressure: 130/70   Liters of 02: ra  Post-exercise vitals:   SpO2: 94   HR: 57   BP: 106/60   Liters of O2: ra   CBG: na  Dr. [name], Medical Director Dr. Rito EhrlichKrishnan is immediately available during today's Pulmonary Rehab session for [facility] on 02/22/14 at 10:30am class time.

## 2014-02-27 ENCOUNTER — Encounter (HOSPITAL_COMMUNITY): Payer: 59

## 2014-03-01 ENCOUNTER — Encounter (HOSPITAL_COMMUNITY): Payer: 59

## 2014-03-05 DIAGNOSIS — J438 Other emphysema: Secondary | ICD-10-CM | POA: Diagnosis not present

## 2014-03-06 ENCOUNTER — Encounter (HOSPITAL_COMMUNITY)
Admission: RE | Admit: 2014-03-06 | Discharge: 2014-03-06 | Disposition: A | Discharge status: Home / Self Care | Payer: 59 | Source: Ambulatory Visit | Attending: Pulmonary Disease | Admitting: Pulmonary Disease

## 2014-03-06 NOTE — Progress Notes (Signed)
Today, Hailey [name] exercised at [facility]. Service time was from 10:30am to 12:15pm.  The patient exercised for more than 31 minutes performing aerobic, strengthening, and stretching exercises. Oxygen saturation, heart rate, blood pressure, rate of perceived exertion, and shortness of breath were all monitored before, during, and after exercise. Hailey [name] presented with no problems at today's exercise session.   There was no workload change during today's exercise session.  Pre-exercise vitals:   Weight kg: 62.1   Liters of O2: ra   SpO2: 94   HR: 82   BP: 100/68   CBG: na  Exercise vitals:   Highest heartrate:  113   Lowest oxygen saturation: 86% increased to 90% with pursed lip breathing   Highest blood pressure: 130/72   Liters of 02: ra  Post-exercise vitals:   SpO2: 95   HR: 81   BP: 112/64   Liters of O2: ra   CBG: na  Dr. Kalman Shan, Medical Director Dr. Waymon Amato is immediately available during today's Pulmonary Rehab session for [facility] on 03/06/14 at 10:30am class time.

## 2014-03-08 ENCOUNTER — Encounter (HOSPITAL_COMMUNITY)
Admission: RE | Admit: 2014-03-08 | Discharge: 2014-03-08 | Disposition: A | Discharge status: Home / Self Care | Payer: 59 | Source: Ambulatory Visit | Attending: Pulmonary Disease | Admitting: Pulmonary Disease

## 2014-03-08 DIAGNOSIS — J438 Other emphysema: Secondary | ICD-10-CM | POA: Diagnosis not present

## 2014-03-08 NOTE — Progress Notes (Signed)
Today, Hailey [name] exercised at [name]. Service time was from 1100 to 1215.  The patient exercised for more than 31 minutes performing aerobic, strengthening, and stretching exercises. Oxygen saturation, heart rate, blood pressure, rate of perceived exertion, and shortness of breath were all monitored before, during, and after exercise. Hailey [name] presented with no problems at today's exercise session.   There was no workload change during today's exercise session.  Pre-exercise vitals:   Weight kg: 61.6   Liters of O2: ra   SpO2: 91   HR: 73   BP: 104/60   CBG: na  Exercise vitals:   Highest heartrate:  106   Lowest oxygen saturation: 92   Highest blood pressure: 118/64   Liters of 02: ra  Post-exercise vitals:   SpO2: 95   HR: 82   BP: 124/64   Liters of O2: ra   CBG: na  Dr. Kalman Shan, Medical Director Dr.  Rosanne Sack immediately available during today's Pulmonary Rehab session for [facility] on 03/08/2014 at 1100 class time.

## 2014-03-09 ENCOUNTER — Encounter: Payer: Self-pay | Admitting: Pulmonary Disease

## 2014-03-09 ENCOUNTER — Ambulatory Visit (INDEPENDENT_AMBULATORY_CARE_PROVIDER_SITE_OTHER): Payer: 59 | Admitting: Pulmonary Disease

## 2014-03-09 VITALS — BP 110/70 | HR 72 | Ht 62.0 in | Wt 137.0 lb

## 2014-03-09 DIAGNOSIS — Z9989 Dependence on other enabling machines and devices: Principal | ICD-10-CM

## 2014-03-09 DIAGNOSIS — J439 Emphysema, unspecified: Secondary | ICD-10-CM

## 2014-03-09 DIAGNOSIS — J438 Other emphysema: Secondary | ICD-10-CM

## 2014-03-09 DIAGNOSIS — G4733 Obstructive sleep apnea (adult) (pediatric): Secondary | ICD-10-CM

## 2014-03-09 NOTE — Assessment & Plan Note (Signed)
Improved.  She can try decreasing how much she uses symbicort.  She uses spiriva prn.  She is not needing albuterol much.  She has done well with pulmonary rehab.

## 2014-03-09 NOTE — Progress Notes (Signed)
Chief Complaint  Patient presents with  . Follow-up    Pt reports good tolerance of CPAP. Denies mask or pressure issues.     History of Present Illness: Hailey [name] is a 63 y.o. 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  She is doing well with CPAP.  She has nasal pillows.  She is sleeping better, and feels more rested during the day.  She is doing better with her breathing.  She does not use spiriva regularly, and has not needed this for the past two weeks.  She does not use proair much.  She wants to know if she can decrease how often she uses symbicort.  TESTS: CT chest 09/09/10 >> moderate emphysema Spirometry 10/01/10 >> FEV1 0.97(42%), FEV1% 36 CT chest 09/15/13 >> atherosclerosis, enlarged pulmonary arteries, moderate/severe centrilobular/paraseptal  ONO with RA 10/16/13 >> test time 8 hrs 1 min. Baseline SpO2 92%, low SpO2 78%. Spent 23 min with SpO2 < 88%. A1AT 11/06/13 >> 140, MM Echo 11/21/13 >> EF 65 to 70%, mild RV dilation. PFT 11/27/13 >> FEV1 1.09 (51%), FEV1% 51, TLC 4.76 (107%), DLCO 69%, +BD PSG 12/19/13 >> AHI 16.3, SaO2 low 84%. CPAP 8 cm H2O >> AHI 0.4, +R.   PMHx, PSHx, Medications, Allergies, Fhx, Shx reviewed.  Physical Exam:  General - No distress ENT - No sinus tenderness, no oral exudate, no LAN Cardiac - s1s2 regular, no murmur Chest - No wheeze/rales/dullness Back - No focal tenderness Abd - Soft, non-tender Ext - No edema Neuro - Normal strength Skin - No rashes Psych - normal mood, and behavior   Assessment/Plan:  Coralyn Helling, MD [place] [facility]/Critical Care/Sleep Pager:  [phone]

## 2014-03-09 NOTE — Patient Instructions (Signed)
Will get copy of CPAP report Follow up in 6 months 

## 2014-03-09 NOTE — Assessment & Plan Note (Signed)
She is compliant with therapy and report benefit from CPAP.  Will get her download.

## 2014-03-13 ENCOUNTER — Encounter (HOSPITAL_COMMUNITY)
Admission: RE | Admit: 2014-03-13 | Discharge: 2014-03-13 | Disposition: A | Discharge status: Home / Self Care | Payer: 59 | Source: Ambulatory Visit | Attending: Pulmonary Disease | Admitting: Pulmonary Disease

## 2014-03-13 DIAGNOSIS — J438 Other emphysema: Secondary | ICD-10-CM | POA: Diagnosis present

## 2014-03-13 DIAGNOSIS — I2789 Other specified pulmonary heart diseases: Secondary | ICD-10-CM | POA: Insufficient documentation

## 2014-03-13 NOTE — Progress Notes (Signed)
Today, Hailey [name] exercised at [facility]. Service time was from 1030 to 1215.  The patient exercised for more than 31 minutes performing aerobic, strengthening, and stretching exercises. Oxygen saturation, heart rate, blood pressure, rate of perceived exertion, and shortness of breath were all monitored before, during, and after exercise. Hailey [name] presented with no problems at today's exercise session.   There was no workload change during today's exercise session.  Pre-exercise vitals:   Weight kg: 61.8   Liters of O2: ra   SpO2: 95   HR: 79   BP: 102/62   CBG: na  Exercise vitals:   Highest heartrate:  100   Lowest oxygen saturation: 90   Highest blood pressure: 126/60   Liters of 02: ra  Post-exercise vitals:   SpO2: 96   HR: 81   BP: 114/70   Liters of O2: ra   CBG: na  Dr. Kalman Shan, Medical Director Dr. Jomarie Longs is immediately available during today's Pulmonary Rehab session for [facility] on 03/13/2014 at 1030 class time.

## 2014-03-14 ENCOUNTER — Encounter: Payer: Self-pay | Admitting: Pulmonary Disease

## 2014-03-15 ENCOUNTER — Encounter (HOSPITAL_COMMUNITY): Payer: 59

## 2014-03-20 ENCOUNTER — Encounter (HOSPITAL_COMMUNITY)
Admission: RE | Admit: 2014-03-20 | Discharge: 2014-03-20 | Disposition: A | Discharge status: Home / Self Care | Payer: 59 | Source: Ambulatory Visit | Attending: Pulmonary Disease | Admitting: Pulmonary Disease

## 2014-03-20 DIAGNOSIS — J438 Other emphysema: Secondary | ICD-10-CM | POA: Diagnosis not present

## 2014-03-20 NOTE — Progress Notes (Signed)
Today, [name] exercised at [facility] [facility]. Service time was from 1030 to 1215.  The patient exercised for more than 31 minutes performing aerobic, strengthening, and stretching exercises. Oxygen saturation, heart rate, blood pressure, rate of perceived exertion, and shortness of breath were all monitored before, during, and after exercise. [name] presented with no problems at today's exercise session.   There was no workload change during today's exercise session.  Pre-exercise vitals:   Weight kg: 62.3   Liters of O2: ra   SpO2: 95   HR: 74   BP: 126/64   CBG: na  Exercise vitals:   Highest heartrate:  110   Lowest oxygen saturation: 92   Highest blood pressure: 108/60   Liters of 02: ra  Post-exercise vitals:   SpO2: 97   HR: 74   BP: 108/60   Liters of O2: ra   CBG: na  Dr. Kalman Shan, Medical Director Dr. Cena Benton is immediately available during today's Pulmonary Rehab session for [facility] on 03/20/2014 at 1030 class time.

## 2014-03-22 ENCOUNTER — Encounter (HOSPITAL_COMMUNITY)
Admission: RE | Admit: 2014-03-22 | Discharge: 2014-03-22 | Disposition: A | Discharge status: Home / Self Care | Payer: 59 | Source: Ambulatory Visit | Attending: Pulmonary Disease | Admitting: Pulmonary Disease

## 2014-03-22 DIAGNOSIS — J438 Other emphysema: Secondary | ICD-10-CM | POA: Diagnosis not present

## 2014-03-22 NOTE — Progress Notes (Signed)
Today, Hailey [name] exercised at [facility] [facility] [facility]. Service time was from 10:30am to 12:30pm.  The patient exercised for more than 31 minutes performing aerobic, strengthening, and stretching exercises. Oxygen saturation, heart rate, blood pressure, rate of perceived exertion, and shortness of breath were all monitored before, during, and after exercise. Hailey [name] presented with no problems at today's exercise session.  The patient attended education class on Nutrition with Mickle Plumb.  There was no workload change during today's exercise session.  Pre-exercise vitals:   Weight kg: 61.8   Liters of O2: ra   SpO2: 95   HR: 67   BP: 96/62   CBG: na  Exercise vitals:   Highest heartrate:  93   Lowest oxygen saturation: 90   Highest blood pressure: 104/61   Liters of 02: ra  Post-exercise vitals:   SpO2: 96   HR: 72   BP: 124/60   Liters of O2: ra   CBG: na  Dr. Kalman Shan, Medical Director Dr. Vanessa Barbara is immediately available during today's Pulmonary Rehab session for [facility] on 03/22/14 at 10:30am class time.

## 2014-03-27 ENCOUNTER — Encounter (HOSPITAL_COMMUNITY)
Admission: RE | Admit: 2014-03-27 | Discharge: 2014-03-27 | Disposition: A | Discharge status: Home / Self Care | Payer: 59 | Source: Ambulatory Visit | Attending: Pulmonary Disease | Admitting: Pulmonary Disease

## 2014-03-27 DIAGNOSIS — J438 Other emphysema: Secondary | ICD-10-CM | POA: Diagnosis not present

## 2014-03-27 NOTE — Progress Notes (Signed)
Today, Hailey [name] exercised at [facility] [facility] [facility]. Service time was from 1030 to 1215.  The patient exercised for more than 31 minutes performing aerobic, strengthening, and stretching exercises. Oxygen saturation, heart rate, blood pressure, rate of perceived exertion, and shortness of breath were all monitored before, during, and after exercise. Ayame presented with no problems at today's exercise session.   There was no workload change during today's exercise session.  Pre-exercise vitals:   Weight kg: 61.2   Liters of O2: ra   SpO2: 96   HR: 71   BP: 124/70   CBG: na  Exercise vitals:   Highest heartrate:  103   Lowest oxygen saturation: 92   Highest blood pressure: 110/68   Liters of 02: ra  Post-exercise vitals:   SpO2: 96   HR: 80   BP: 124/64   Liters of O2: ra   CBG: na  Dr. Kalman Shan, Medical Director Dr. Vanessa Barbara is immediately available during today's Pulmonary Rehab session for [facility] on 03/27/2014 at 1030 class time.

## 2014-03-29 ENCOUNTER — Encounter (HOSPITAL_COMMUNITY)
Admission: RE | Admit: 2014-03-29 | Discharge: 2014-03-29 | Disposition: A | Discharge status: Home / Self Care | Payer: 59 | Source: Ambulatory Visit | Attending: Pulmonary Disease | Admitting: Pulmonary Disease

## 2014-03-29 DIAGNOSIS — J438 Other emphysema: Secondary | ICD-10-CM | POA: Diagnosis not present

## 2014-03-29 NOTE — Progress Notes (Signed)
Today, Farah exercised at [facility] [facility] [facility]. Service time was from 1030 to 1230.  The patient exercised for more than 31 minutes performing aerobic, strengthening, and stretching exercises. Oxygen saturation, heart rate, blood pressure, rate of perceived exertion, and shortness of breath were all monitored before, during, and after exercise. Brailynn presented with no problems at today's exercise session. Patient attended the exercise for the pulmonary rehab patient class.   There was no workload change during today's exercise session.  Pre-exercise vitals:   Weight kg: 61.7   Liters of O2: RA   SpO2: 95   HR: 72   BP: 110/70   CBG: NA  Exercise vitals:   Highest heartrate:  105   Lowest oxygen saturation: 90   Highest blood pressure: 110/60   Liters of 02: RA  Post-exercise vitals:   SpO2: 96   HR: 83   BP: 102/60   Liters of O2: RA   CBG: NA Dr. Kalman Shan, Medical Director Dr. Rhona Leavens is immediately available during today's Pulmonary Rehab session for [facility] on 03/29/2014 at 1030 class time.

## 2014-04-03 ENCOUNTER — Encounter (HOSPITAL_COMMUNITY)
Admission: RE | Admit: 2014-04-03 | Discharge: 2014-04-03 | Disposition: A | Discharge status: Home / Self Care | Payer: 59 | Source: Ambulatory Visit | Attending: Pulmonary Disease | Admitting: Pulmonary Disease

## 2014-04-03 DIAGNOSIS — J438 Other emphysema: Secondary | ICD-10-CM | POA: Diagnosis not present

## 2014-04-03 NOTE — Progress Notes (Signed)
Today, Hailey [name] exercised at [facility] [facility]. Service time was from 1030 to 1215.  The patient exercised for more than 31 minutes performing aerobic, strengthening, and stretching exercises. Oxygen saturation, heart rate, blood pressure, rate of perceived exertion, and shortness of breath were all monitored before, during, and after exercise. [name] presented with no problems at today's exercise session.   There was no workload change during today's exercise session.  Pre-exercise vitals:   Weight kg: 61.0   Liters of O2: RA   SpO2: 93   HR: 71   BP: 128/54   CBG: NA  Exercise vitals:   Highest heartrate:  105   Lowest oxygen saturation: 94   Highest blood pressure: 112/66   Liters of 02: RA  Post-exercise vitals:   SpO2: 95   HR: 77   BP: 102/60   Liters of O2: RA   CBG: NA Dr. Kalman Shan, Medical Director Dr. Rhona [name] is immediately available during today's Pulmonary Rehab session for [facility] on 04/03/2014 at 1030 class time.

## 2014-04-05 ENCOUNTER — Telehealth (HOSPITAL_COMMUNITY): Payer: Self-pay | Admitting: Nurse Practitioner

## 2014-04-05 ENCOUNTER — Encounter (HOSPITAL_COMMUNITY): Payer: 59

## 2014-04-10 ENCOUNTER — Encounter (HOSPITAL_COMMUNITY)
Admission: RE | Admit: 2014-04-10 | Discharge: 2014-04-10 | Disposition: A | Discharge status: Home / Self Care | Payer: 59 | Source: Ambulatory Visit | Attending: Pulmonary Disease | Admitting: Pulmonary Disease

## 2014-04-10 DIAGNOSIS — J438 Other emphysema: Secondary | ICD-10-CM | POA: Diagnosis not present

## 2014-04-10 NOTE — Progress Notes (Signed)
Today, Hailey [name] exercised at [facility]. Service time was from 10:30am to 12:15pm.  The patient exercised for more than 31 minutes performing aerobic, strengthening, and stretching exercises. Oxygen saturation, heart rate, blood pressure, rate of perceived exertion, and shortness of breath were all monitored before, during, and after exercise. Hailey [name] presented with no problems at today's exercise session.   There was an increase in workload change during today's exercise session.  Pre-exercise vitals:   Weight kg: 61.8   Liters of O2: ra   SpO2: 95   HR: 68   BP: 106/64   CBG: na  Exercise vitals:   Highest heartrate:  102   Lowest oxygen saturation: 92   Highest blood pressure: 106/64   Liters of 02: ra  Post-exercise vitals:   SpO2: 96   HR: 76   BP: 100/60   Liters of O2: ra   CBG: na  Dr. Kalman ShanMurali [name], Medical Director Dr. Gwenlyn PerkingMadera is immediately available during today's Pulmonary Rehab session for [facility] on 04/10/14 at 10:30am class time.

## 2014-04-12 ENCOUNTER — Encounter (HOSPITAL_COMMUNITY)
Admission: RE | Admit: 2014-04-12 | Discharge: 2014-04-12 | Disposition: A | Discharge status: Home / Self Care | Payer: 59 | Source: Ambulatory Visit | Attending: Pulmonary Disease | Admitting: Pulmonary Disease

## 2014-04-12 DIAGNOSIS — J439 Emphysema, unspecified: Secondary | ICD-10-CM | POA: Insufficient documentation

## 2014-04-12 DIAGNOSIS — Z5189 Encounter for other specified aftercare: Secondary | ICD-10-CM | POA: Insufficient documentation

## 2014-04-12 DIAGNOSIS — I279 Pulmonary heart disease, unspecified: Secondary | ICD-10-CM | POA: Insufficient documentation

## 2014-04-12 NOTE — Progress Notes (Signed)
Today, Tameeka exercised at [facility]. Service time was from 1115 to 1330.  The patient exercised for more than 31 minutes performing aerobic, strengthening, and stretching exercises. Oxygen saturation, heart rate, blood pressure, rate of perceived exertion, and shortness of breath were all monitored before, during, and after exercise. Hailey [name] presented with no problems at today's exercise session. Hailey [name] also attended a question and answer session with a pulmonary MD.  There was no workload change during today's exercise session.  Pre-exercise vitals:   Weight kg: 60.8   Liters of O2: ra   SpO2: 97   HR: 70   BP: 108/70   CBG: na  Exercise vitals:   Highest heartrate:  96   Lowest oxygen saturation: 92   Highest blood pressure: 112/70   Liters of 02: ra  Post-exercise vitals:   SpO2: 95   HR: 76   BP: 98/60   Liters of O2: ra   CBG: na  Dr. [name], Medical Director Dr. David StallFeliz-Ortiz is immediately available during today's [facility]monary Rehab session [facility] on 04/12/2014 at 1115 class time.

## 2014-04-24 ENCOUNTER — Encounter (HOSPITAL_COMMUNITY)
Admission: RE | Admit: 2014-04-24 | Discharge: 2014-04-24 | Disposition: A | Discharge status: Home / Self Care | Payer: 59 | Source: Ambulatory Visit | Attending: Pulmonary Disease | Admitting: Pulmonary Disease

## 2014-04-24 DIAGNOSIS — Z5189 Encounter for other specified aftercare: Secondary | ICD-10-CM | POA: Diagnosis not present

## 2014-04-24 NOTE — Progress Notes (Signed)
Today, Hailey [name] exercised at [facility]. Service time was from 10:30am to 12:00pm.  The patient exercised for more than 31 minutes performing aerobic, strengthening, and stretching exercises. Oxygen saturation, heart rate, blood pressure, rate of perceived exertion, and shortness of breath were all monitored before, during, and after exercise. Hailey [name] presented with no problems at today's exercise session.   There was no workload change during today's exercise session.  Pre-exercise vitals:   Weight kg: 61.1   Liters of O2: ra   SpO2: 96   HR: 71   BP: 96/54   CBG: na  Exercise vitals:   Highest heartrate:  100   Lowest oxygen saturation: 88% increased to 96% with pursed lip breathing   Highest blood pressure: 118/70   Liters of 02: ra  Post-exercise vitals:   SpO2: 95   HR: 77   BP: 108/60   Liters of O2: ra   CBG: na  Dr. [name], Medical Director Dr. Gwenlyn PerkingMadera is immediately available during today's Pulmonary Rehab [name] on 04/24/14 at 10:30am class time.

## 2014-04-26 ENCOUNTER — Encounter (HOSPITAL_COMMUNITY)
Admission: RE | Admit: 2014-04-26 | Discharge: 2014-04-26 | Disposition: A | Discharge status: Home / Self Care | Payer: 59 | Source: Ambulatory Visit | Attending: Pulmonary Disease | Admitting: Pulmonary Disease

## 2014-04-26 DIAGNOSIS — Z5189 Encounter for other specified aftercare: Secondary | ICD-10-CM | POA: Diagnosis not present

## 2014-04-26 NOTE — Progress Notes (Signed)
Today, [name] exercised at [facility]. Service time was from 10:30am to 12:30pm.  The patient exercised for more than 31 minutes performing aerobic, strengthening, and stretching exercises. Oxygen saturation, heart rate, blood pressure, rate of perceived exertion, and shortness of breath were all monitored before, during, and after exercise. Hailey [name] presented with no problems at today's exercise session. Patient attended education class today with Hailey [name] on Oxygen Safety.  There was no workload change during today's exercise session.  Pre-exercise vitals:   Weight kg: 61.7   Liters of O2: ra   SpO2: 93   HR: 68   BP: 100/46   CBG: na  Exercise vitals:   Highest heartrate:  112   Lowest oxygen saturation: 95   Highest blood pressure: 122/70   Liters of 02: ra  Post-exercise vitals:   SpO2: 95   HR: 79   BP: 104/60   Liters of O2: ra   CBG: na  Dr. [name], Medical Director Dr. David StallFeliz-Ortiz is immediately available during today's Pulmonary Rehab session for Hailey [name] on 04/26/14 at 10:30am class time.

## 2014-05-01 ENCOUNTER — Encounter (HOSPITAL_COMMUNITY)
Admission: RE | Admit: 2014-05-01 | Discharge: 2014-05-01 | Disposition: A | Discharge status: Home / Self Care | Payer: 59 | Source: Ambulatory Visit | Attending: Pulmonary Disease | Admitting: Pulmonary Disease

## 2014-05-01 DIAGNOSIS — Z5189 Encounter for other specified aftercare: Secondary | ICD-10-CM | POA: Diagnosis not present

## 2014-05-01 NOTE — Progress Notes (Signed)
Today, Tanijah exercised at [facility]. Service time was from 10:30am to 12:15pm.  The patient exercised for more than 31 minutes performing aerobic, strengthening, and stretching exercises. Oxygen saturation, heart rate, blood pressure, rate of perceived exertion, and shortness of breath were all monitored before, during, and after exercise. [name] presented with no problems at today's exercise session.   There was an increase in workload change during today's exercise session.  Pre-exercise vitals:   Weight kg: 62.0   Liters of O2: ra   SpO2: 95   HR: 68   BP: 102/70   CBG: na  Exercise vitals:   Highest heartrate:  104   Lowest oxygen saturation: 94   Highest blood pressure: 130/70   Liters of 02: ra  Post-exercise vitals:   SpO2: 95   HR: 80   BP: 104/70   Liters of O2: ra   CBG: na  Dr. [name], Medical Director Dr. David StallFeliz-Ortiz is immediately available during today's Pulmonary Rehab session [facility] on 05/01/14 at 10:30am class time.

## 2014-05-03 ENCOUNTER — Encounter (HOSPITAL_COMMUNITY)
Admission: RE | Admit: 2014-05-03 | Discharge: 2014-05-03 | Disposition: A | Discharge status: Home / Self Care | Payer: 59 | Source: Ambulatory Visit | Attending: Pulmonary Disease | Admitting: Pulmonary Disease

## 2014-05-03 DIAGNOSIS — Z5189 Encounter for other specified aftercare: Secondary | ICD-10-CM | POA: Diagnosis not present

## 2014-05-03 NOTE — Progress Notes (Signed)
Hailey [name] completed a Six-Minute Walk Test on 05/03/14 . Hailey [name] walked 1,309 feet with 0 breaks.  The patient's lowest oxygen saturation was 90 , highest heart rate was 108 , and highest blood pressure was 140/60. The patient was on room air. Hailey [name] stated that nothing hindered her walk test.

## 2014-05-04 ENCOUNTER — Encounter (HOSPITAL_COMMUNITY): Payer: 59

## 2014-05-07 ENCOUNTER — Encounter (HOSPITAL_COMMUNITY): Payer: 59

## 2014-05-08 ENCOUNTER — Encounter (HOSPITAL_COMMUNITY): Payer: 59

## 2014-05-08 NOTE — Progress Notes (Signed)
Discharge Note Hailey [name] is being discharge from the pulmonary rehab exercise program after completing 24 exercise and education sessions. Hailey [name] made great progress during the program. She tolerated increases in workloads and at graduation she was walking on the treadmill at a level 3.6 and an incline of 8%. She was also rowing at a level 5, and had transitioned to using the elliptical. She perfected the pursed lip breathing technique and utilized it during exercise to diminish her SOB and to keep her oxygen levels above 90% on room air. She stated her stamina and strength had definitely increased and she was more comfortable working with her Systems analystpersonal trainer, now knowing how to manage her SOB and oxygen levels. She benefited from the education session as evidenced by an increased score of 12/14 on the pulmonary education  post test. She had scored 8/14 on her pretest prior to beginning the program. It was a pleasure to have [name] participate in our program.

## 2014-05-09 ENCOUNTER — Encounter (HOSPITAL_COMMUNITY): Payer: 59

## 2014-05-10 ENCOUNTER — Encounter (HOSPITAL_COMMUNITY): Payer: 59

## 2014-05-11 ENCOUNTER — Encounter (HOSPITAL_COMMUNITY): Payer: 59

## 2014-05-14 ENCOUNTER — Encounter (HOSPITAL_COMMUNITY): Payer: 59

## 2014-05-25 ENCOUNTER — Other Ambulatory Visit: Payer: Self-pay | Admitting: Pulmonary Disease

## 2014-06-12 ENCOUNTER — Telehealth: Payer: Self-pay | Admitting: Pulmonary Disease

## 2014-06-12 ENCOUNTER — Other Ambulatory Visit: Payer: Self-pay | Admitting: Pulmonary Disease

## 2014-06-12 MED ORDER — BUDESONIDE-FORMOTEROL FUMARATE 160-4.5 MCG/ACT IN AERO: INHALATION_SPRAY | RESPIRATORY_TRACT | Status: DC

## 2014-06-12 NOTE — Telephone Encounter (Signed)
Called and spoke to pt. Pt requesting 90 days refill for symbicort. [facility] Pt verbalized understanding and denied any further questions or concerns at this time.

## 2014-09-04 ENCOUNTER — Other Ambulatory Visit (HOSPITAL_COMMUNITY)
Admission: RE | Admit: 2014-09-04 | Discharge: 2014-09-04 | Disposition: A | Discharge status: Home / Self Care | Payer: 59 | Source: Ambulatory Visit | Attending: Family Medicine | Admitting: Family Medicine

## 2014-09-04 ENCOUNTER — Other Ambulatory Visit: Payer: Self-pay | Admitting: Family Medicine

## 2014-09-04 DIAGNOSIS — Z124 Encounter for screening for malignant neoplasm of cervix: Secondary | ICD-10-CM | POA: Diagnosis not present

## 2014-09-07 LAB — CYTOLOGY - PAP

## 2014-11-19 ENCOUNTER — Encounter: Payer: Self-pay | Admitting: Pulmonary Disease

## 2014-11-19 ENCOUNTER — Ambulatory Visit (INDEPENDENT_AMBULATORY_CARE_PROVIDER_SITE_OTHER): Payer: 59 | Admitting: Pulmonary Disease

## 2014-11-19 VITALS — BP 104/70 | HR 74 | Ht 62.0 in | Wt 135.8 lb

## 2014-11-19 DIAGNOSIS — Z9989 Dependence on other enabling machines and devices: Principal | ICD-10-CM

## 2014-11-19 DIAGNOSIS — G4733 Obstructive sleep apnea (adult) (pediatric): Secondary | ICD-10-CM | POA: Diagnosis not present

## 2014-11-19 DIAGNOSIS — J439 Emphysema, unspecified: Secondary | ICD-10-CM | POA: Diagnosis not present

## 2014-11-19 MED ORDER — UMECLIDINIUM-VILANTEROL 62.5-25 MCG/INH IN AEPB: 1.0000 | INHALATION_SPRAY | Freq: Every day | RESPIRATORY_TRACT | Status: DC

## 2014-11-19 NOTE — Patient Instructions (Signed)
Anoro one puff daily Stop spiriva and symbicort Call in 1 week to update status while using Anoro Follow up in 6 months

## 2014-11-19 NOTE — Progress Notes (Signed)
Chief Complaint  Patient presents with  . Follow-up    Wears CPAP machine nightly - pt states that she had a break in therapy d/t grinding her teeth every nihgt when she wore it. Denies any issues with mask/pressure.     History of Present Illness: Hailey SchwalbeLinda [name] is a 64 y.o. 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  She is doing well with CPAP.  She has nasal pillows.  She is sleeping better, and feels more rested during the day.  She has completed pulmonary rehab.  She was being assessed for COPD study.  She came off spiriva and symbicort and her breathing got worse.  She has since restarted both inhalers.  She is worried that she gained 5 lbs after resuming symbicort and wants to get off ICS.  She denies cough, wheeze, chest pain, sputum, or leg swelling.  She exercises at the gym regularly.  TESTS: CT chest 09/09/10 >> moderate emphysema Spirometry 10/01/10 >> FEV1 0.97(42%), FEV1% 36 CT chest 09/15/13 >> atherosclerosis, enlarged pulmonary arteries, moderate/severe centrilobular/paraseptal  ONO with RA 10/16/13 >> test time 8 hrs 1 min. Baseline SpO2 92%, low SpO2 78%. Spent 23 min with SpO2 < 88%. A1AT 11/06/13 >> 140, MM Echo 11/21/13 >> EF 65 to 70%, mild RV dilation. PFT 11/27/13 >> FEV1 1.09 (51%), FEV1% 51, TLC 4.76 (107%), DLCO 69%, +BD PSG 12/19/13 >> AHI 16.3, SaO2 low 84%. CPAP 8 cm H2O >> AHI 0.4, +R.   PMHx, PSHx, Medications, Allergies, Fhx, Shx reviewed.  Physical Exam: Blood pressure 104/70, pulse 74, height 5\' 2"  (1.575 m), weight 135 lb 12.8 oz (61.598 kg), SpO2 94 %. Body mass index is 24.83 kg/(m^2).  General - No distress ENT - No sinus tenderness, no oral exudate, no LAN Cardiac - s1s2 regular, no murmur Chest - No wheeze/rales/dullness Back - No focal tenderness Abd - Soft, non-tender Ext - No edema Neuro - Normal strength Skin - No rashes Psych - normal mood, and behavior   Assessment/Plan:  COPD with  emphysema. She is concerned about use of ICS. Plan: - will have her try anoro - stop symbicort and spiriva - prn albuterol  OSA. She is compliant with CPAP and reports benefit. Plan: - continue CPAP 8 cm H2O   Coralyn HellingVineet Veron Senner, MD [place] [facility]/Critical Care/Sleep Pager:  [phone]

## 2014-11-23 ENCOUNTER — Telehealth: Payer: Self-pay | Admitting: Pulmonary Disease

## 2014-11-23 MED ORDER — UMECLIDINIUM-VILANTEROL 62.5-25 MCG/INH IN AEPB: 1.0000 | INHALATION_SPRAY | Freq: Every day | RESPIRATORY_TRACT | Status: DC

## 2014-11-23 NOTE — Telephone Encounter (Signed)
Rx has been sent in. Pt is aware. Nothing further was needed. 

## 2014-11-27 IMAGING — CT CT CHEST W/ CM
3 series · 16 of 29 positions shown, 18 images · IV contrast (75CC OMNI 300)
Comparison: Chest x-ray 08/15/2013.  Chest CT 09/09/2010.

CLINICAL DATA: Abnormal chest x-ray.

EXAM:
CT CHEST WITH CONTRAST
TECHNIQUE: Multidetector CT imaging of the chest was performed during
intravenous contrast administration.
CONTRAST:  75mL OMNIPAQUE IOHEXOL 300 MG/ML  SOLN

[Series 2: chest with · axial · 0.70mm/px · z∈[-244,-64]mm · 5 of 60 slices shown, 7 images]
[im 12/60  mediastinal]
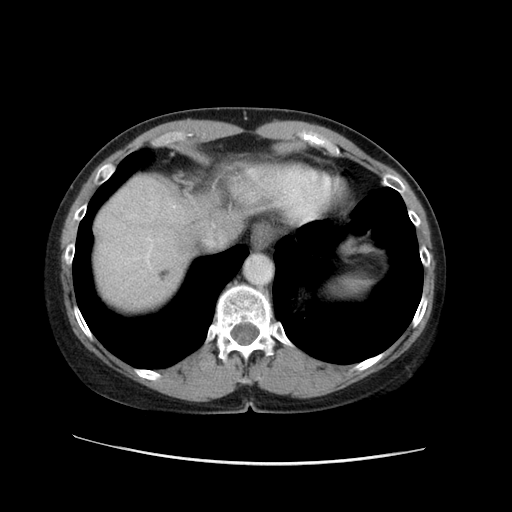
[im 12/60  lung]
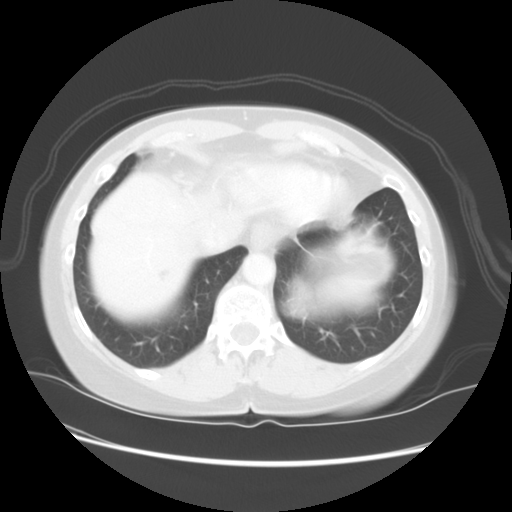
[im 24/60  lung]
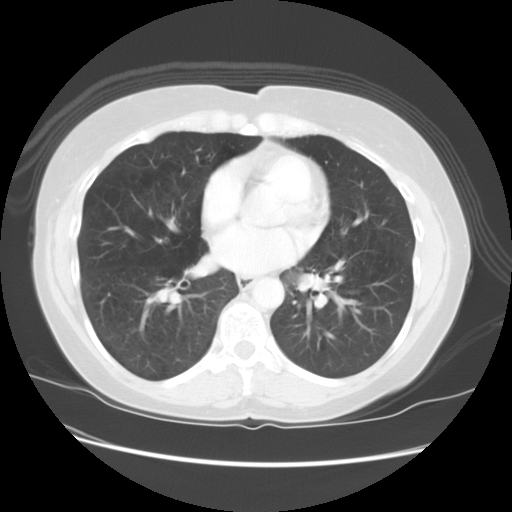
[im 32/60  lung]
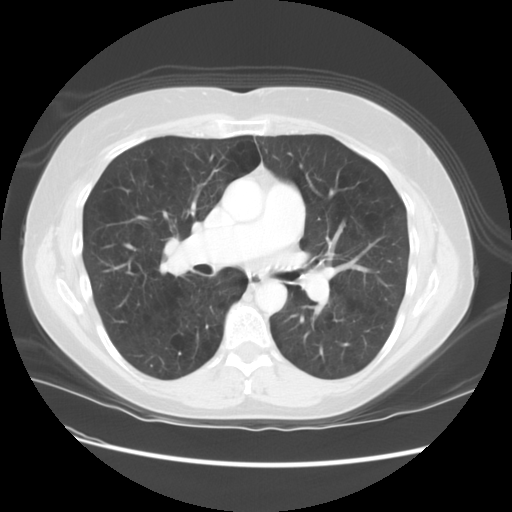
[im 36/60  lung]
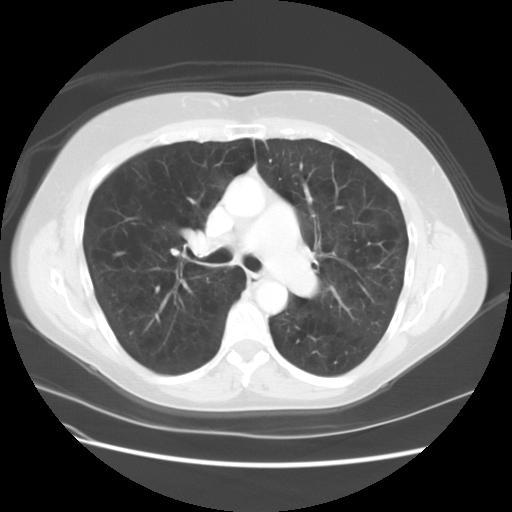
[im 48/60  mediastinal]
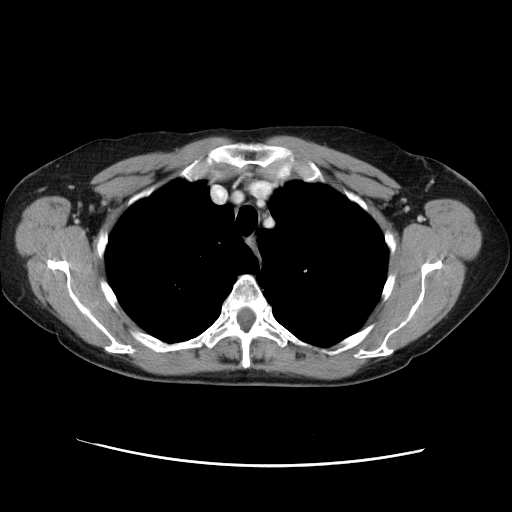
[im 48/60  lung]
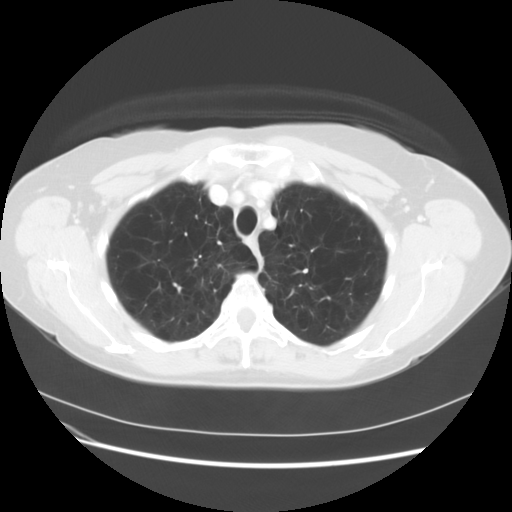

[Series 400: sag · sagittal · 0.70mm/px · 8 of 127 slices shown]
[im 11/127  lung]
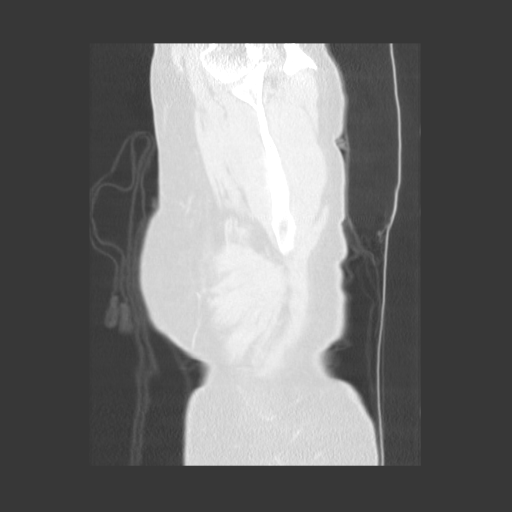
[im 32/127  lung]
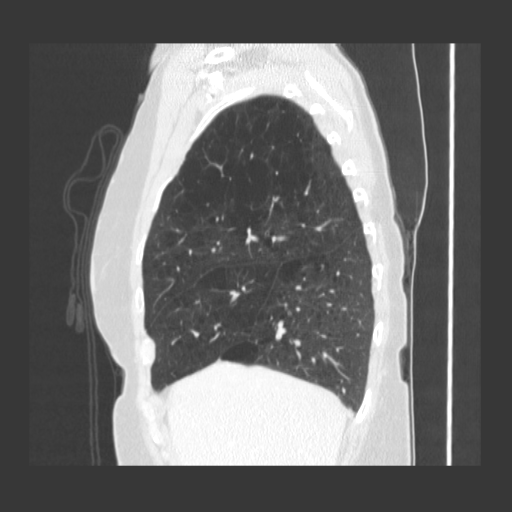
[im 43/127  lung]
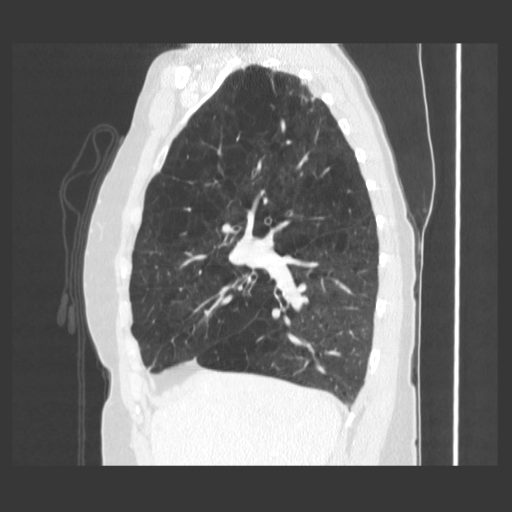
[im 53/127  lung]
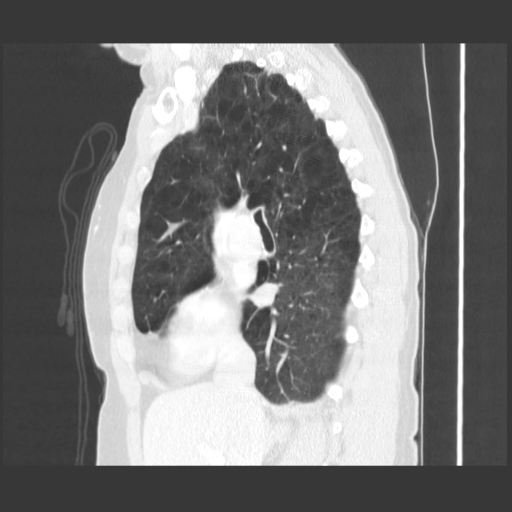
[im 74/127  lung]
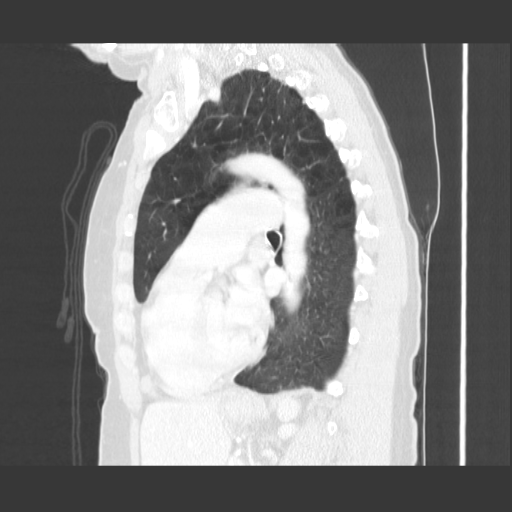
[im 85/127  lung]
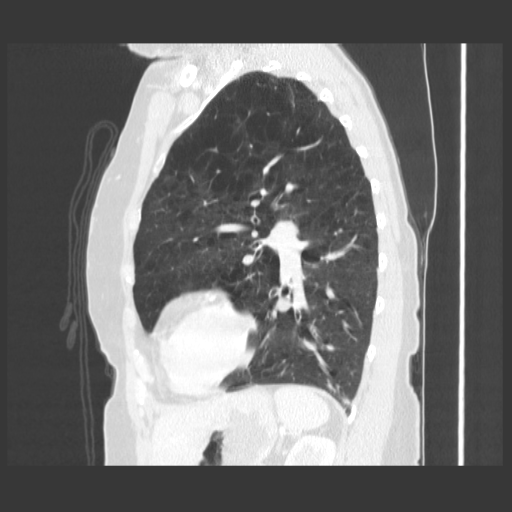
[im 95/127  lung]
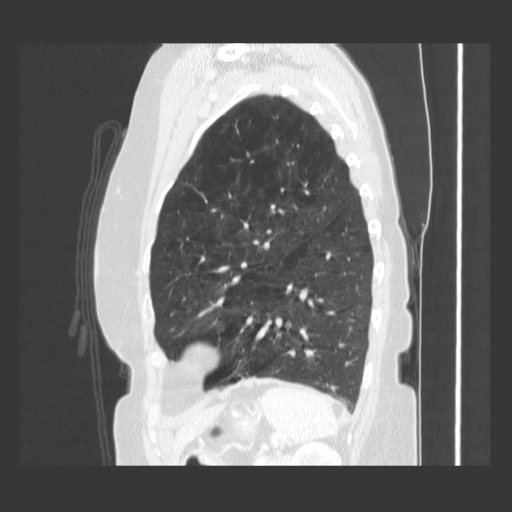
[im 116/127  lung]
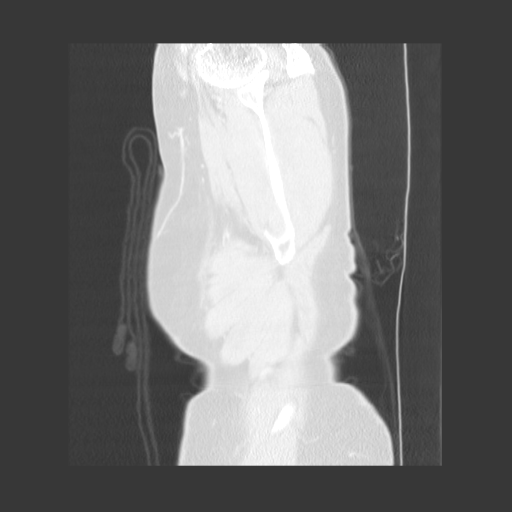

[Series 401: cor · coronal · 0.70mm/px · 3 of 96 slices shown]
[im 12/96  lung]
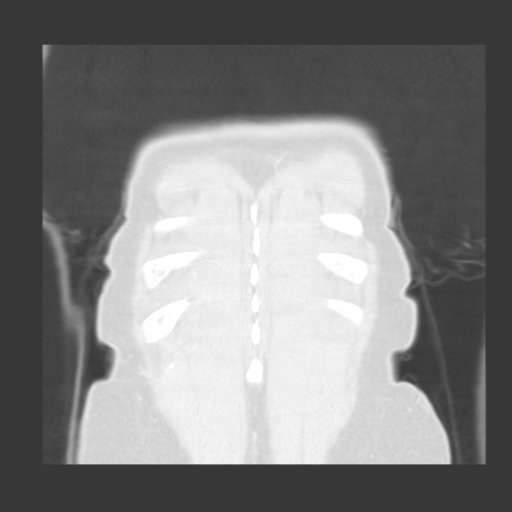
[im 24/96  lung]
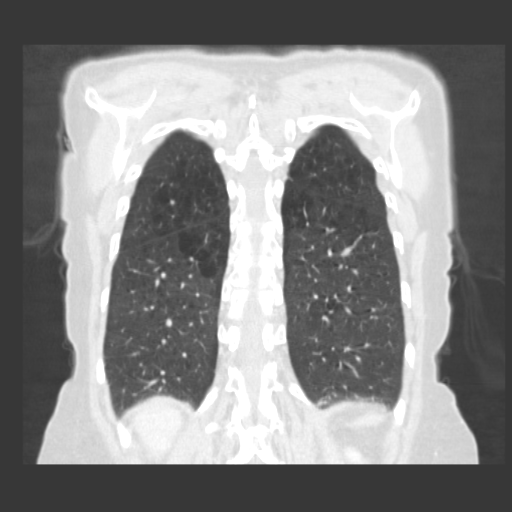
[im 36/96  lung]
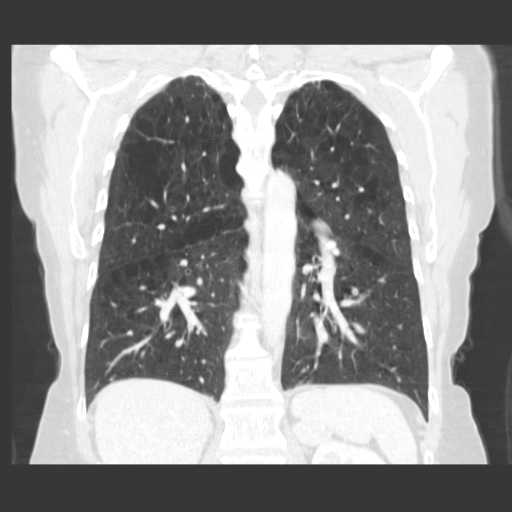

[16 of 29 positions shown; findings below may reference images not displayed]

FINDINGS: Mediastinum: Heart size is normal. There is no significant
pericardial fluid, thickening or pericardial calcification. There is
atherosclerosis of the thoracic aorta, the great vessels of the
mediastinum and the coronary arteries, including calcified
atherosclerotic plaque in the left anterior descending coronary
artery. Dilatation of the pulmonic trunk (3.6 cm in diameter) and
main pulmonary arteries may suggest underlying pulmonary arterial
hypertension. No pathologically enlarged mediastinal or hilar lymph
nodes. Esophagus is unremarkable in appearance.

Lungs/Pleura: Moderate to severe centrilobular and paraseptal
emphysema, most pronounced in the upper lobes of the lungs
bilaterally. No definite suspicious appearing pulmonary nodules or
masses. No acute consolidative airspace disease. No pleural
effusions. Mild diffuse bronchial wall thickening.

Upper Abdomen: Several small low attenuation hepatic lesions are
noted, predominantly too small to characterize. The largest of these
is 12 mm in diameter in segment 4A, compatible with a small cyst.

Musculoskeletal: There are no aggressive appearing lytic or blastic
lesions noted in the visualized portions of the skeleton.
IMPRESSION: 1. Diffuse bronchial wall thickening with moderate to severe
centrilobular and paraseptal emphysema; imaging findings suggestive
of underlying COPD.
2. No suspicious appearing pulmonary nodules or masses.
3. Dilatation of the pulmonic trunk and main pulmonary arteries
bilaterally, suggestive of pulmonary arterial hypertension.
4. Atherosclerosis, including left anterior descending coronary
artery disease. Please note that although the presence of coronary
artery calcium documents the presence of coronary artery disease,
the severity of this disease and any potential stenosis cannot be
assessed on this non-gated CT examination. Assessment for potential
risk factor modification, dietary therapy or pharmacologic therapy
may be warranted, if clinically indicated.
5. Additional incidental findings, as above.

## 2015-01-08 ENCOUNTER — Telehealth: Payer: Self-pay | Admitting: Pulmonary Disease

## 2015-01-08 NOTE — Telephone Encounter (Signed)
Patient says that Dr. Craige CottaSood put her on Anoro 6 weeks ago.  She says that Anoro has been working really well. Sunday, she was at the pool and she had to use her rescue inhaler that night and yesterday, she says when she went out for her walk this morning, her o2 level dropped to 85%  Patient thinks it may be due to the humidity, but she is not sure, she says that she will be leaving Thursday to go to [place] few weeks and she wants to know if she needs to change her medications before she leaves for [place].  VS - please advise.

## 2015-01-08 NOTE — Telephone Encounter (Signed)
I am not sure her oxygen being low would be related to her inhaler.  Can she come in for a visit to assess further.

## 2015-01-08 NOTE — Telephone Encounter (Signed)
Called spoke with pt. She scheduled an appt to see VS tomorrow at 2:15.

## 2015-01-09 ENCOUNTER — Encounter: Payer: Self-pay | Admitting: Pulmonary Disease

## 2015-01-09 ENCOUNTER — Ambulatory Visit (INDEPENDENT_AMBULATORY_CARE_PROVIDER_SITE_OTHER): Payer: 59 | Admitting: Pulmonary Disease

## 2015-01-09 VITALS — BP 132/82 | HR 96 | Ht 62.0 in | Wt 136.0 lb

## 2015-01-09 DIAGNOSIS — J439 Emphysema, unspecified: Secondary | ICD-10-CM

## 2015-01-09 DIAGNOSIS — J209 Acute bronchitis, unspecified: Secondary | ICD-10-CM | POA: Diagnosis not present

## 2015-01-09 DIAGNOSIS — G4733 Obstructive sleep apnea (adult) (pediatric): Secondary | ICD-10-CM

## 2015-01-09 MED ORDER — DOXYCYCLINE HYCLATE 100 MG PO TABS: 100.0000 mg | ORAL_TABLET | Freq: Two times a day (BID) | ORAL | Status: DC

## 2015-01-09 MED ORDER — PREDNISONE 10 MG PO TABS: ORAL_TABLET | ORAL | Status: DC

## 2015-01-09 NOTE — Progress Notes (Signed)
Chief Complaint  Patient presents with  . Acute Visit    Pt c/o increased SOB x 3 days. Pt states that she can feel her HR increase and feels that her O2 levels are dropping.     History of Present Illness: Hailey SchwalbeLinda [name] is a 64 y.o. 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  She was doing well with anoro.  She was keeping up with her exercise.  Over the weekend she was exposed to cigarette smoke at the pool, and her neighbor was putting out moth balls.  After these exposures she noticed having more trouble with her breathing.  She was having a cough with clear sputum.  She checked her pulse ox with exercise and her oxygen was low.  She had to use her albuterol several times.    She denies sinus congestion, sore throat, headache, fever, abdominal pain, or leg swelling.  She is going to [place] with family tomorrow.  Her breathing feels better today.   TESTS: CT chest 09/09/10 >> moderate emphysema Spirometry 10/01/10 >> FEV1 0.97(42%), FEV1% 36 CT chest 09/15/13 >> atherosclerosis, enlarged pulmonary arteries, moderate/severe centrilobular/paraseptal  ONO with RA 10/16/13 >> test time 8 hrs 1 min. Baseline SpO2 92%, low SpO2 78%. Spent 23 min with SpO2 < 88%. A1AT 11/06/13 >> 140, MM Echo 11/21/13 >> EF 65 to 70%, mild RV dilation. PFT 11/27/13 >> FEV1 1.09 (51%), FEV1% 51, TLC 4.76 (107%), DLCO 69%, +BD PSG 12/19/13 >> AHI 16.3, SaO2 low 84%. CPAP 8 cm H2O >> AHI 0.4, +R.   PMHx, PSHx, Medications, Allergies, Fhx, Shx reviewed.  Physical Exam: BP 132/82 mmHg  Pulse 96  Ht 5\' 2"  (1.575 m)  Wt 136 lb (61.689 kg)  BMI 24.87 kg/m2  SpO2 95%  General - No distress ENT - No sinus tenderness, no oral exudate, no LAN Cardiac - s1s2 regular, no murmur Chest - No wheeze/rales/dullness Back - No focal tenderness Abd - Soft, non-tender Ext - No edema Neuro - Normal strength Skin - No rashes Psych - normal mood, and  behavior   Assessment/Plan:  Acute bronchitis. Likely related to environmental exposure. Plan: - defer chest xray - will give her script for prednisone and doxycycline for her trip to [place] >> advised her not to fill unless her symptoms get worse  COPD with emphysema. She is concerned about use of ICS. Plan: - Anoro - prn albuterol  OSA. She is compliant with CPAP and reports benefit. Plan: - continue CPAP 8 cm H2O   Hailey HellingVineet Raymonde Hamblin, MD  Pulmonary/Critical Care/Sleep Pager:  [phone]

## 2015-01-09 NOTE — Patient Instructions (Addendum)
You can fill script for prednisone and doxycycline if your respiratory symptoms get worse Follow up in 6 months

## 2015-05-01 ENCOUNTER — Telehealth: Payer: Self-pay | Admitting: Pulmonary Disease

## 2015-05-01 MED ORDER — UMECLIDINIUM-VILANTEROL 62.5-25 MCG/INH IN AEPB: 1.0000 | INHALATION_SPRAY | Freq: Every day | RESPIRATORY_TRACT | Status: DC

## 2015-05-01 NOTE — Telephone Encounter (Signed)
Spoke with pt. She needs a refill on Anoro. Rx has been sent in. Nothing further was needed.

## 2015-06-20 ENCOUNTER — Inpatient Hospital Stay (HOSPITAL_COMMUNITY)
Admission: EM | Admit: 2015-06-20 | Discharge: 2015-06-22 | DRG: 191 | Disposition: A | Discharge status: Home / Self Care | Payer: 59 | Attending: Internal Medicine | Admitting: Internal Medicine

## 2015-06-20 ENCOUNTER — Emergency Department (HOSPITAL_COMMUNITY): Payer: 59

## 2015-06-20 ENCOUNTER — Encounter (HOSPITAL_COMMUNITY): Payer: Self-pay | Admitting: Emergency Medicine

## 2015-06-20 DIAGNOSIS — I272 Other secondary pulmonary hypertension: Secondary | ICD-10-CM | POA: Diagnosis present

## 2015-06-20 DIAGNOSIS — Z9049 Acquired absence of other specified parts of digestive tract: Secondary | ICD-10-CM | POA: Diagnosis not present

## 2015-06-20 DIAGNOSIS — J441 Chronic obstructive pulmonary disease with (acute) exacerbation: Secondary | ICD-10-CM | POA: Diagnosis present

## 2015-06-20 DIAGNOSIS — Z803 Family history of malignant neoplasm of breast: Secondary | ICD-10-CM

## 2015-06-20 DIAGNOSIS — Z833 Family history of diabetes mellitus: Secondary | ICD-10-CM | POA: Diagnosis not present

## 2015-06-20 DIAGNOSIS — G4733 Obstructive sleep apnea (adult) (pediatric): Secondary | ICD-10-CM | POA: Diagnosis not present

## 2015-06-20 DIAGNOSIS — R0902 Hypoxemia: Secondary | ICD-10-CM | POA: Diagnosis present

## 2015-06-20 DIAGNOSIS — R0602 Shortness of breath: Secondary | ICD-10-CM | POA: Diagnosis present

## 2015-06-20 DIAGNOSIS — T380X5A Adverse effect of glucocorticoids and synthetic analogues, initial encounter: Secondary | ICD-10-CM | POA: Diagnosis present

## 2015-06-20 DIAGNOSIS — E861 Hypovolemia: Secondary | ICD-10-CM | POA: Diagnosis present

## 2015-06-20 DIAGNOSIS — Z8249 Family history of ischemic heart disease and other diseases of the circulatory system: Secondary | ICD-10-CM

## 2015-06-20 DIAGNOSIS — R739 Hyperglycemia, unspecified: Secondary | ICD-10-CM | POA: Diagnosis present

## 2015-06-20 DIAGNOSIS — Z79899 Other long term (current) drug therapy: Secondary | ICD-10-CM

## 2015-06-20 DIAGNOSIS — J440 Chronic obstructive pulmonary disease with acute lower respiratory infection: Secondary | ICD-10-CM | POA: Diagnosis present

## 2015-06-20 DIAGNOSIS — J209 Acute bronchitis, unspecified: Secondary | ICD-10-CM | POA: Diagnosis present

## 2015-06-20 DIAGNOSIS — E871 Hypo-osmolality and hyponatremia: Secondary | ICD-10-CM | POA: Diagnosis not present

## 2015-06-20 DIAGNOSIS — Z87891 Personal history of nicotine dependence: Secondary | ICD-10-CM | POA: Diagnosis not present

## 2015-06-20 LAB — BASIC METABOLIC PANEL
Anion gap: 8 (ref 5–15)
BUN: 10 mg/dL (ref 6–20)
CALCIUM: 8.9 mg/dL (ref 8.9–10.3)
CO2: 25 mmol/L (ref 22–32)
CREATININE: 0.72 mg/dL (ref 0.44–1.00)
Chloride: 99 mmol/L — ABNORMAL LOW (ref 101–111)
GFR calc Af Amer: >60 mL/min (ref >60)
GFR calc non Af Amer: >60 mL/min (ref >60)
GLUCOSE: 214 mg/dL — AB (ref 65–99)
Potassium: 3.9 mmol/L (ref 3.5–5.1)
Sodium: 132 mmol/L — ABNORMAL LOW (ref 135–145)

## 2015-06-20 LAB — CBC WITH DIFFERENTIAL/PLATELET
BASOS PCT: 0 %
Basophils Absolute: 0 10*3/uL (ref 0.0–0.1)
EOS PCT: 1 %
Eosinophils Absolute: 0.2 10*3/uL (ref 0.0–0.7)
HCT: 39.1 % (ref 36.0–46.0)
Hemoglobin: 13.4 g/dL (ref 12.0–15.0)
LYMPHS PCT: 10 %
Lymphs Abs: 1.2 10*3/uL (ref 0.7–4.0)
MCH: 30.9 pg (ref 26.0–34.0)
MCHC: 34.3 g/dL (ref 30.0–36.0)
MCV: 90.1 fL (ref 78.0–100.0)
MONO ABS: 0.2 10*3/uL (ref 0.1–1.0)
MONOS PCT: 2 %
Neutro Abs: 11 10*3/uL — ABNORMAL HIGH (ref 1.7–7.7)
Neutrophils Relative %: 87 %
PLATELETS: 239 10*3/uL (ref 150–400)
RBC: 4.34 MIL/uL (ref 3.87–5.11)
RDW: 12.1 % (ref 11.5–15.5)
WBC: 12.7 10*3/uL — ABNORMAL HIGH (ref 4.0–10.5)

## 2015-06-20 MED ORDER — ONDANSETRON HCL 4 MG/2ML IJ SOLN: 4.0000 mg | Freq: Three times a day (TID) | INTRAMUSCULAR | Status: DC | PRN

## 2015-06-20 MED ORDER — ALBUTEROL SULFATE (2.5 MG/3ML) 0.083% IN NEBU: 2.5000 mg | INHALATION_SOLUTION | RESPIRATORY_TRACT | Status: DC | PRN

## 2015-06-20 MED ORDER — IBUPROFEN 800 MG PO TABS
800.0000 mg | ORAL_TABLET | Freq: Once | ORAL | Status: AC
  Administered 2015-06-20: 800 mg via ORAL
  Filled 2015-06-20: qty 1

## 2015-06-20 NOTE — ED Notes (Signed)
Per EMS-when fire dept arrived pt was 78%. Placed on non-rebreather. Took 3 minutes to increase SpO2 to 98%. Has been sick with cold/congestion s/s for the past 2 days. Attempted using Mucinex with no alleviation. Wheezing in all fields upon EMS arrival. Given 2 g Magnesium Sulfate, 125 mg Solu-Medrol, 2 Duonebs given. Placed on CPAP approximately 40 minutes prior to arrival to ED. VSS.

## 2015-06-20 NOTE — ED Notes (Signed)
Mouth swabs given for patient complaint dry mouth.  Patient sitting up in bed awake and alert and able to perform own mouth care.

## 2015-06-20 NOTE — Progress Notes (Signed)
Pt removed from EMS CPAP and placed on 4 LPM [place].  Pt currently tolerating well at this time.  RT to monitor and assess as needed.

## 2015-06-20 NOTE — ED Provider Notes (Signed)
CSN: [id number]     Arrival date & time 06/20/15  1818 History   First MD Initiated Contact with Patient 06/20/15 1838     Chief Complaint  Patient presents with  . Shortness of Breath  . COPD  . Wheezing      HPI Per EMS-when fire dept arrived pt was 78%. Placed on non-rebreather. Took 3 minutes to increase SpO2 to 98%. Has been sick with cold/congestion s/s for the past 2 days. Attempted using Mucinex with no alleviation. Wheezing in all fields upon EMS arrival. Given 2 g Magnesium Sulfate, 125 mg Solu-Medrol, 2 Duonebs given. Placed on CPAP approximately 40 minutes prior to arrival to ED. VSS.  Past Medical History  Diagnosis Date  . COPD with emphysema (HCC)   . Pneumothorax     24 years ago  . OSA (obstructive sleep apnea)   . Secondary pulmonary hypertension [facility](HCC)    Past Surgical History  Procedure Laterality Date  . Appendectomy    . 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 Coronary artery disease Father   . Diabetes Father   . Cancer Sister     breast   Social History  Substance Use Topics  . Smoking status: Former Smoker -- 1.00 packs/day for 20 years    Types: Cigarettes    Quit date: 07/13/2000  . Smokeless tobacco: None  . Alcohol Use: Yes     Comment: occasional dinks   OB History    No data available     Review of Systems  Unable to perform ROS: Acuity of condition      Allergies  Review of patient's allergies indicates no known allergies.  Home Medications   Prior to Admission medications   Medication Sig Start Date End Date Taking? Authorizing Provider  albuterol (PROAIR HFA) 108 (90 BASE) MCG/ACT inhaler Inhale 2 puffs into the lungs 4 (four) times daily as needed for wheezing or shortness of breath. 10/01/10 01/09/15  Coralyn HellingVineet Sood, MD  Ascorbic Acid (VITAMIN C) 500 MG tablet Take 500 mg by mouth daily.      Historical Provider, MD  Cholecalciferol  (VITAMIN D) 2000 UNITS CAPS Take 2,000 Units by mouth daily.    Historical Provider, MD  doxycycline (VIBRA-TABS) 100 MG tablet Take 1 tablet (100 mg total) by mouth 2 (two) times daily. 01/09/15   Coralyn HellingVineet Sood, MD  guaiFENesin (MUCINEX) 600 MG 12 hr tablet Take 600 mg by mouth as needed.    Historical Provider, MD  hydrocortisone 0.5 % cream Apply topically as needed.      Historical Provider, MD  predniSONE (DELTASONE) 10 MG tablet 4 pills for 2 days, 3 pills for 2 days, 2 pills for 2 days, 1 pill for 2 days 01/09/15   Coralyn HellingVineet Sood, MD  Umeclidinium-Vilanterol (ANORO ELLIPTA) 62.5-25 MCG/INH AEPB Inhale 1 puff into the lungs daily. 05/01/15   Coralyn HellingVineet Sood, MD  vitamin E 100 UNIT capsule Take 200 Units by mouth daily.    Historical Provider, MD   BP 126/61 mmHg  Pulse 97  Resp 21  SpO2 98% Physical Exam  Constitutional: She is oriented to person, place, and time. She appears well-developed and well-nourished. No distress.  HENT:  Head: Normocephalic and atraumatic.  Eyes: Pupils are equal, round, and reactive to light.  Neck: Normal range of motion.  Cardiovascular: Normal rate and intact distal pulses.   Pulmonary/Chest:  Accessory muscle usage present. Tachypnea noted. She is in respiratory distress. She has wheezes.  Abdominal: Normal appearance. She exhibits no distension.  Musculoskeletal: Normal range of motion.  Neurological: She is alert and oriented to person, place, and time. No cranial nerve deficit.  Skin: Skin is warm and dry. No rash noted.  Psychiatric: She has a normal mood and affect. Her behavior is normal.  Nursing note and vitals reviewed.   ED Course  Procedures (including critical care time) Medications - No data to display Patient received 2 g of magnesium sulfate, 125 mg of Solu-Medrol, and 2 DuoNeb's by ambulance in route. Labs Review Labs Reviewed  BASIC METABOLIC PANEL - Abnormal; Notable for the following:    Sodium 132 (*)    Chloride 99 (*)    Glucose,  Bld 214 (*)    All other components within normal limits  CBC WITH DIFFERENTIAL/PLATELET - Abnormal; Notable for the following:    WBC 12.7 (*)    Neutro Abs 11.0 (*)    All other components within normal limits    Imaging Review Dg Chest 2 View  06/20/2015  CLINICAL DATA:  Acute onset of cough and congestion. Decreased O2 saturation and wheezing. Initial encounter. EXAM: CHEST  2 VIEW COMPARISON:  Chest radiograph performed 08/15/2013, and CT of the chest performed 09/15/2013 FINDINGS: The lungs are hyperexpanded, with flattening of the hemidiaphragms, compatible with COPD. There is no evidence of focal opacification, pleural effusion or pneumothorax. The heart is normal in size; the mediastinal contour is within normal limits. No acute osseous abnormalities are seen. IMPRESSION: Findings of COPD.  Lungs otherwise grossly clear. Electronically Signed   By: Roanna Raider M.D.   On: 06/20/2015 19:49   I have personally reviewed and evaluated these images and lab results as part of my medical decision-making.   EKG Interpretation   Date/Time:  Thursday June 20 2015 18:26:57 EST Ventricular Rate:  92 PR Interval:  189 QRS Duration: 105 QT Interval:  367 QTC Calculation: 454 R Axis:   96 Text Interpretation:  Sinus rhythm RAE, consider biatrial enlargement  Right axis deviation Nonspecific T abnormalities, lateral leads Baseline  wander in lead(s) I II aVR aVL No previous tracing Confirmed by Radford Pax   MD, Cherisse Carrell ([id number]) on 06/20/2015 8:19:14 PM      MDM   Final diagnoses:  COPD exacerbation (HCC)  Hypoxia        Nelva Nay, MD 06/20/15 2057

## 2015-06-20 NOTE — ED Notes (Signed)
Bed: RESB Expected date:  Expected time:  Means of arrival:  Comments: Ems- open fracture

## 2015-06-21 ENCOUNTER — Encounter (HOSPITAL_COMMUNITY): Payer: Self-pay | Admitting: Internal Medicine

## 2015-06-21 DIAGNOSIS — G4733 Obstructive sleep apnea (adult) (pediatric): Secondary | ICD-10-CM

## 2015-06-21 DIAGNOSIS — E871 Hypo-osmolality and hyponatremia: Secondary | ICD-10-CM | POA: Insufficient documentation

## 2015-06-21 DIAGNOSIS — J441 Chronic obstructive pulmonary disease with (acute) exacerbation: Principal | ICD-10-CM

## 2015-06-21 LAB — GLUCOSE, CAPILLARY
GLUCOSE-CAPILLARY: 105 mg/dL — AB (ref 65–99)
GLUCOSE-CAPILLARY: 146 mg/dL — AB (ref 65–99)
Glucose-Capillary: 166 mg/dL — ABNORMAL HIGH (ref 65–99)

## 2015-06-21 LAB — CBC
HCT: 36.1 % (ref 36.0–46.0)
Hemoglobin: 12.5 g/dL (ref 12.0–15.0)
MCH: 30.5 pg (ref 26.0–34.0)
MCHC: 34.6 g/dL (ref 30.0–36.0)
MCV: 88 fL (ref 78.0–100.0)
PLATELETS: 243 10*3/uL (ref 150–400)
RBC: 4.1 MIL/uL (ref 3.87–5.11)
RDW: 11.9 % (ref 11.5–15.5)
WBC: 4.8 10*3/uL (ref 4.0–10.5)

## 2015-06-21 LAB — BASIC METABOLIC PANEL
ANION GAP: 7 (ref 5–15)
BUN: 8 mg/dL (ref 6–20)
CALCIUM: 8.9 mg/dL (ref 8.9–10.3)
CO2: 24 mmol/L (ref 22–32)
Chloride: 98 mmol/L — ABNORMAL LOW (ref 101–111)
Creatinine, Ser: 0.5 mg/dL (ref 0.44–1.00)
Glucose, Bld: 141 mg/dL — ABNORMAL HIGH (ref 65–99)
POTASSIUM: 4.6 mmol/L (ref 3.5–5.1)
Sodium: 129 mmol/L — ABNORMAL LOW (ref 135–145)

## 2015-06-21 MED ORDER — SODIUM CHLORIDE 0.9 % IJ SOLN
3.0000 mL | Freq: Two times a day (BID) | INTRAMUSCULAR | Status: DC
  Administered 2015-06-21: 3 mL via INTRAVENOUS

## 2015-06-21 MED ORDER — IPRATROPIUM BROMIDE 0.02 % IN SOLN
0.5000 mg | RESPIRATORY_TRACT | Status: DC
  Administered 2015-06-21: 0.5 mg via RESPIRATORY_TRACT
  Filled 2015-06-21: qty 2.5

## 2015-06-21 MED ORDER — IPRATROPIUM-ALBUTEROL 0.5-2.5 (3) MG/3ML IN SOLN
3.0000 mL | Freq: Four times a day (QID) | RESPIRATORY_TRACT | Status: DC
  Administered 2015-06-21 – 2015-06-22 (×3): 3 mL via RESPIRATORY_TRACT
  Filled 2015-06-21 (×3): qty 3

## 2015-06-21 MED ORDER — ENOXAPARIN SODIUM 40 MG/0.4ML ~~LOC~~ SOLN
40.0000 mg | SUBCUTANEOUS | Status: DC
  Administered 2015-06-21: 40 mg via SUBCUTANEOUS
  Filled 2015-06-21: qty 0.4

## 2015-06-21 MED ORDER — ACETAMINOPHEN 325 MG PO TABS: 650.0000 mg | ORAL_TABLET | Freq: Four times a day (QID) | ORAL | Status: DC | PRN

## 2015-06-21 MED ORDER — ALBUTEROL SULFATE (2.5 MG/3ML) 0.083% IN NEBU
2.5000 mg | INHALATION_SOLUTION | RESPIRATORY_TRACT | Status: DC
  Administered 2015-06-21: 2.5 mg via RESPIRATORY_TRACT

## 2015-06-21 MED ORDER — INSULIN ASPART 100 UNIT/ML ~~LOC~~ SOLN: 0.0000 [IU] | Freq: Every day | SUBCUTANEOUS | Status: DC

## 2015-06-21 MED ORDER — IPRATROPIUM-ALBUTEROL 0.5-2.5 (3) MG/3ML IN SOLN
3.0000 mL | Freq: Three times a day (TID) | RESPIRATORY_TRACT | Status: DC
  Administered 2015-06-21: 3 mL via RESPIRATORY_TRACT
  Filled 2015-06-21: qty 3

## 2015-06-21 MED ORDER — BUDESONIDE 0.25 MG/2ML IN SUSP
0.2500 mg | Freq: Two times a day (BID) | RESPIRATORY_TRACT | Status: DC
  Administered 2015-06-21 – 2015-06-22 (×3): 0.25 mg via RESPIRATORY_TRACT
  Filled 2015-06-21 (×3): qty 2

## 2015-06-21 MED ORDER — SODIUM CHLORIDE 0.9 % IV SOLN
INTRAVENOUS | Status: DC
  Administered 2015-06-21 (×2): via INTRAVENOUS

## 2015-06-21 MED ORDER — METHYLPREDNISOLONE SODIUM SUCC 40 MG IJ SOLR
40.0000 mg | Freq: Two times a day (BID) | INTRAMUSCULAR | Status: DC
  Administered 2015-06-21 – 2015-06-22 (×3): 40 mg via INTRAVENOUS
  Filled 2015-06-21 (×3): qty 1

## 2015-06-21 MED ORDER — INSULIN ASPART 100 UNIT/ML ~~LOC~~ SOLN
0.0000 [IU] | Freq: Three times a day (TID) | SUBCUTANEOUS | Status: DC
  Administered 2015-06-21: 1 [IU] via SUBCUTANEOUS

## 2015-06-21 MED ORDER — DM-GUAIFENESIN ER 30-600 MG PO TB12
1.0000 | ORAL_TABLET | Freq: Two times a day (BID) | ORAL | Status: DC
  Administered 2015-06-21 – 2015-06-22 (×3): 1 via ORAL
  Filled 2015-06-21 (×3): qty 1

## 2015-06-21 MED ORDER — ONDANSETRON HCL 4 MG/2ML IJ SOLN: 4.0000 mg | Freq: Four times a day (QID) | INTRAMUSCULAR | Status: DC | PRN

## 2015-06-21 MED ORDER — ACETAMINOPHEN 650 MG RE SUPP: 650.0000 mg | Freq: Four times a day (QID) | RECTAL | Status: DC | PRN

## 2015-06-21 MED ORDER — ALBUTEROL SULFATE (2.5 MG/3ML) 0.083% IN NEBU: 2.5000 mg | INHALATION_SOLUTION | RESPIRATORY_TRACT | Status: DC | PRN

## 2015-06-21 MED ORDER — DOXYCYCLINE HYCLATE 100 MG IV SOLR
100.0000 mg | Freq: Two times a day (BID) | INTRAVENOUS | Status: DC
  Administered 2015-06-21 (×3): 100 mg via INTRAVENOUS
  Filled 2015-06-21 (×4): qty 100

## 2015-06-21 MED ORDER — ONDANSETRON HCL 4 MG PO TABS: 4.0000 mg | ORAL_TABLET | Freq: Four times a day (QID) | ORAL | Status: DC | PRN

## 2015-06-21 NOTE — Progress Notes (Signed)
[facility] Progress Note   Hailey SchwalbeLinda [name]  UJW:[id number]  DOB: 05/09/1951  DOA: 06/20/2015 PCP: Hailey SayersLEMENE,KRISTINA, NP  Brief narrative: Hailey SchwalbeLinda [name] is a 64 y.o. female with COPD, obstructive sleep apnea and pulmonary hypertension who presents to the ER for shortness of breath. She is found to be wheezing and admitted for COPD exacerbation   Subjective: Feels much better. Has a mild cough today. No chest pain and not short of breath.  Assessment/Plan: Principal Problem:   COPD exacerbation / acute bronchitis -Continue steroids, nebulizer treatments, doxycycline and O2-symptoms improving  Active Problems: Hyponatremia -Sodium dropping to 129 this morning from 132 last night-we'll start normal saline as this is likely related to hypovolemia-continue to follow    OSA (obstructive sleep apnea) --Continue C Pap when asleep    Code Status:     Code Status Orders        Start     Ordered   06/21/15 0053  Full code   Continuous     06/21/15 0053     Family Communication:  Disposition Plan: Hopefully home tomorrow DVT prophylaxis: Lovenox Consultants: Procedures:  Antibiotics: Anti-infectives    Start     Dose/Rate Route Frequency Ordered Stop   06/21/15 0015  doxycycline (VIBRAMYCIN) 100 mg in dextrose 5 % 250 mL IVPB     100 mg 125 mL/hr over 120 Minutes Intravenous 2 times daily 06/21/15 0005        Objective: Filed Weights   06/20/15 2200  Weight: 63.7 kg (140 lb 6.9 oz)    Intake/Output Summary (Last 24 hours) at 06/21/15 1536 Last data filed at 06/21/15 1500  Gross per 24 hour  Intake    760 ml  Output      0 ml  Net    760 ml     Vitals Filed Vitals:   06/21/15 0932 06/21/15 1100 06/21/15 1102 06/21/15 1326  BP:    117/74  Pulse:    87  Temp:    98.2 F (36.8 C)  TempSrc:    Oral  Resp:    20  Height:      Weight:      SpO2: 98% 96% 93% 96%    Exam:  General:  Pt is alert, not in acute distress  HEENT: No icterus, No thrush,  oral mucosa moist  Cardiovascular: regular rate and rhythm, S1/S2 No murmur  Respiratory: Mild wheezing in left upper lung field- oxygen saturation 96% on 2 L of oxygen  Abdomen: Soft, +Bowel sounds, non tender, non distended, no guarding  MSK: No LE edema, cyanosis or clubbing  Data Reviewed: Basic Metabolic Panel:  Recent Labs Lab 06/20/15 1931 06/21/15 0433  NA 132* 129*  K 3.9 4.6  CL 99* 98*  CO2 25 24  GLUCOSE 214* 141*  BUN 10 8  CREATININE 0.72 0.50  CALCIUM 8.9 8.9   Liver Function Tests: No results for input(s): AST, ALT, ALKPHOS, BILITOT, PROT, ALBUMIN in the last 168 hours. No results for input(s): LIPASE, AMYLASE in the last 168 hours. No results for input(s): AMMONIA in the last 168 hours. CBC:  Recent Labs Lab 06/20/15 1931 06/21/15 0433  WBC 12.7* 4.8  NEUTROABS 11.0*  --   HGB 13.4 12.5  HCT 39.1 36.1  MCV 90.1 88.0  PLT 239 243   Cardiac Enzymes: No results for input(s): CKTOTAL, CKMB, CKMBINDEX, TROPONINI in the last 168 hours. BNP (last 3 results) No results for input(s): BNP in the last 8760 hours.  ProBNP (last  3 results) No results for input(s): PROBNP in the last 8760 hours.  CBG:  Recent Labs Lab 06/21/15 1150  GLUCAP 105*    No results found for this or any previous visit (from the past 240 hour(s)).   Studies: Dg Chest 2 View  06/20/2015  CLINICAL DATA:  Acute onset of cough and congestion. Decreased O2 saturation and wheezing. Initial encounter. EXAM: CHEST  2 VIEW COMPARISON:  Chest radiograph performed 08/15/2013, and CT of the chest performed 09/15/2013 FINDINGS: The lungs are hyperexpanded, with flattening of the hemidiaphragms, compatible with COPD. There is no evidence of focal opacification, pleural effusion or pneumothorax. The heart is normal in size; the mediastinal contour is within normal limits. No acute osseous abnormalities are seen. IMPRESSION: Findings of COPD.  Lungs otherwise grossly clear. Electronically  Signed   By: Roanna Raider M.D.   On: 06/20/2015 19:49    Scheduled Meds:  Scheduled Meds: . budesonide (PULMICORT) nebulizer solution  0.25 mg Nebulization BID  . dextromethorphan-guaiFENesin  1 tablet Oral BID  . doxycycline (VIBRAMYCIN) IV  100 mg Intravenous BID  . enoxaparin (LOVENOX) injection  40 mg Subcutaneous Q24H  . insulin aspart  0-5 Units Subcutaneous QHS  . insulin aspart  0-9 Units Subcutaneous TID WC  . ipratropium-albuterol  3 mL Nebulization Q6H  . methylPREDNISolone (SOLU-MEDROL) injection  40 mg Intravenous Q12H  . sodium chloride  3 mL Intravenous Q12H   Continuous Infusions: . sodium chloride 100 mL/hr at 06/21/15 1019    Time spent on care of this patient: 35 min   Hailey Huesman, MD 06/21/2015, 3:36 PM  LOS: 1 day   [facility] Office  [phone] Pager - Text Page per [email or web address] If 7PM-7AM, please contact night-coverage [email or web address]

## 2015-06-21 NOTE — Progress Notes (Signed)
Pt refused CPAP qhs.  Education provided to the Pt but still refuses.  Pt states that she rarely uses it at home.  RT will continue to monitor as needed.

## 2015-06-21 NOTE — H&P (Signed)
[facility] History and Physical  Hailey SchwalbeLinda Gibeault UJW:[id number] DOB: 09/03/1950 DOA: 06/20/2015  Referring physician: Dr. Radford PaxBeaton. PCP: Carolynn SayersLEMENE,KRISTINA, NP  Specialists: Dr. Craige CottaSood. Pulmonologist.  Chief Complaint: Shortness of breath.  HPI: Hailey [name] is a 64 y.o. female with a history of COPD, pulmonary hypertension and OSA presents to the ER because of shortness of breath. Patient states over the last 2 days patient has been having increasing productive cough wheezing and shortness of breath which acutely worsened last evening. Patient was brought in by the EMS and initially was on C Pap. After nebulizer treatment and steroids patient's breathing improved. Chest x-ray did not show anything acute. Patient has been admitted for COPD exacerbation. Patient denies any chest pain fever chills.   Review of Systems: As presented in the history of presenting illness, rest negative.  Past Medical History  Diagnosis Date  . COPD with emphysema (HCC)   . Pneumothorax     24 years ago  . OSA (obstructive sleep apnea)   . Secondary pulmonary hypertension [facility](HCC)    Past Surgical History  Procedure Laterality Date  . Appendectomy    . 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 Her smoking use included Cigarettes. She has a 20 pack-year smoking history. She does not have any smokeless tobacco history on file. She reports that she drinks alcohol. She reports that she does not use illicit drugs. Where does patient live home. Can patient participate in ADLs? Yes.  No Known Allergies  Family History:  Family History  Problem Relation Age of Onset  . Coronary artery disease Father   . Diabetes Father   . Cancer Sister     breast      Prior to Admission medications   Medication Sig Start Date End Date Taking? Authorizing Provider  albuterol (PROAIR HFA) 108 (90 BASE) MCG/ACT inhaler  Inhale 2 puffs into the lungs 4 (four) times daily as needed for wheezing or shortness of breath. Patient taking differently: Inhale 2 puffs into the lungs every 4 (four) hours as needed for wheezing or shortness of breath.  10/01/10 06/20/15 Yes Coralyn HellingVineet Sood, MD  Ascorbic Acid (VITAMIN C) 500 MG tablet Take 500 mg by mouth daily.     Yes Historical Provider, MD  Cholecalciferol (VITAMIN D) 2000 UNITS CAPS Take 2,000 Units by mouth daily.   Yes Historical Provider, MD  Cyanocobalamin (VITAMIN B-12 PO) Take 1 tablet by mouth daily.   Yes Historical Provider, MD  pseudoephedrine-guaifenesin (MUCINEX D) 60-600 MG 12 hr tablet Take 1 tablet by mouth 2 (two) times daily as needed for congestion.   Yes Historical Provider, MD  Umeclidinium-Vilanterol (ANORO ELLIPTA) 62.5-25 MCG/INH AEPB Inhale 1 puff into the lungs daily. Patient taking differently: Inhale 1-2 puffs into the lungs daily.  05/01/15  Yes Coralyn HellingVineet Sood, MD  vitamin E 100 UNIT capsule Take 200 Units by mouth daily.   Yes Historical Provider, MD  doxycycline (VIBRA-TABS) 100 MG tablet Take 1 tablet (100 mg total) by mouth 2 (two) times daily. Patient not taking: Reported on 06/20/2015 01/09/15   Coralyn HellingVineet Sood, MD  predniSONE (DELTASONE) 10 MG tablet 4 pills for 2 days, 3 pills for 2 days, 2 pills for 2 days, 1 pill for 2 days Patient not taking: Reported on 06/20/2015 01/09/15   Coralyn HellingVineet Sood, MD    Physical Exam: Filed Vitals:   06/20/15 1829 06/20/15  2000 06/20/15 2144 06/20/15 2200  BP: 172/80 126/61 127/73 133/67  Pulse: 96 97 90 87  Temp:    98.2 F (36.8 C)  TempSrc:    Oral  Resp: Height:     (1.575 m)  Weight:    63.7 kg (140 lb 6.9 oz)  SpO2: 100% 98% 95% 97%     General:  Moderately built and nourished.  Eyes: Anicteric no pallor.  ENT: No discharge from the ears eyes nose and mouth.  Neck: No mass felt. No JVD appreciated.  Cardiovascular: S1 and S2 heard.  Respiratory: Mild expiratory wheeze no  crepitations.  Abdomen: Soft nontender bowel sounds present.  Skin: No rash.  Musculoskeletal: No edema.  Psychiatric: Appears normal.  Neurologic: Alert awake oriented to time place and person. Moves all extremities.  Labs on Admission:  Basic Metabolic Panel:  Recent Labs Lab 06/20/15 1931  NA 132*  K 3.9  CL 99*  CO2 25  GLUCOSE 214*  BUN 10  CREATININE 0.72  CALCIUM 8.9   Liver Function Tests: No results for input(s): AST, ALT, ALKPHOS, BILITOT, PROT, ALBUMIN in the last 168 hours. No results for input(s): LIPASE, AMYLASE in the last 168 hours. No results for input(s): AMMONIA in the last 168 hours. CBC:  Recent Labs Lab 06/20/15 1931  WBC 12.7*  NEUTROABS 11.0*  HGB 13.4  HCT 39.1  MCV 90.1  PLT 239   Cardiac Enzymes: No results for input(s): CKTOTAL, CKMB, CKMBINDEX, TROPONINI in the last 168 hours.  BNP (last 3 results) No results for input(s): BNP in the last 8760 hours.  ProBNP (last 3 results) No results for input(s): PROBNP in the last 8760 hours.  CBG: No results for input(s): GLUCAP in the last 168 hours.  Radiological Exams on Admission: Dg Chest 2 View  06/20/2015  CLINICAL DATA:  Acute onset of cough and congestion. Decreased O2 saturation and wheezing. Initial encounter. EXAM: CHEST  2 VIEW COMPARISON:  Chest radiograph performed 08/15/2013, and CT of the chest performed 09/15/2013 FINDINGS: The lungs are hyperexpanded, with flattening of the hemidiaphragms, compatible with COPD. There is no evidence of focal opacification, pleural effusion or pneumothorax. The heart is normal in size; the mediastinal contour is within normal limits. No acute osseous abnormalities are seen. IMPRESSION: Findings of COPD.  Lungs otherwise grossly clear. Electronically Signed   By: Roanna Raider M.D.   On: 06/20/2015 19:49    EKG: Independently reviewed. Normal sinus rhythm with RA E.  Assessment/Plan Principal Problem:   COPD exacerbation (HCC) Active  Problems:   Pulmonary hypertension (HCC)   OSA (obstructive sleep apnea)   1. COPD exacerbation - patient's symptoms have largely improved. We will continue with nebulizer Pulmicort IV steroids and doxycycline. Closely observe. 2. OSA on C Pap. 3. Hyperglycemia probably from steroids - will check hemoglobin A1c. 4. Pulmonary hypertension - appears compensated.  I have reviewed patient's old charts and labs. Personally reviewed patient's chest x-ray.   DVT Prophylaxis Lovenox.  Code Status: Full code.  Family Communication: Discussed with patient.  Disposition Plan: Admit to inpatient.    Verne Cove N. [facility] Pager [phone].  If 7PM-7AM, please contact night-coverage [email or web address] Password [email or web address] 06/21/2015, 12:54 AM

## 2015-06-21 NOTE — Progress Notes (Signed)
O2 on room air at rest 96%. O2 on room air ambulating 93% and above. Hailey SicksK. Trig Mcbryar RN

## 2015-06-21 NOTE — Progress Notes (Signed)
Patient states she doesn't usually wear a CPAP and does not want to wear one at this time. RT informed patient if she changes her mind have RN contact RT.

## 2015-06-22 DIAGNOSIS — I272 Other secondary pulmonary hypertension: Secondary | ICD-10-CM

## 2015-06-22 LAB — BASIC METABOLIC PANEL
Anion gap: 5 (ref 5–15)
BUN: 10 mg/dL (ref 6–20)
CALCIUM: 8.7 mg/dL — AB (ref 8.9–10.3)
CHLORIDE: 106 mmol/L (ref 101–111)
CO2: 25 mmol/L (ref 22–32)
CREATININE: 0.5 mg/dL (ref 0.44–1.00)
Glucose, Bld: 123 mg/dL — ABNORMAL HIGH (ref 65–99)
Potassium: 4.1 mmol/L (ref 3.5–5.1)
SODIUM: 136 mmol/L (ref 135–145)

## 2015-06-22 LAB — GLUCOSE, CAPILLARY: GLUCOSE-CAPILLARY: 120 mg/dL — AB (ref 65–99)

## 2015-06-22 LAB — HEMOGLOBIN A1C
Hgb A1c MFr Bld: 5.9 % — ABNORMAL HIGH (ref 4.8–5.6)
MEAN PLASMA GLUCOSE: 123 mg/dL

## 2015-06-22 MED ORDER — IPRATROPIUM-ALBUTEROL 0.5-2.5 (3) MG/3ML IN SOLN
3.0000 mL | Freq: Three times a day (TID) | RESPIRATORY_TRACT | Status: DC
  Administered 2015-06-22: 3 mL via RESPIRATORY_TRACT
  Filled 2015-06-22: qty 3

## 2015-06-22 MED ORDER — DOXYCYCLINE HYCLATE 100 MG PO TABS
100.0000 mg | ORAL_TABLET | Freq: Every day | ORAL | Status: DC
  Administered 2015-06-22: 100 mg via ORAL
  Filled 2015-06-22: qty 1

## 2015-06-22 MED ORDER — DOXYCYCLINE HYCLATE 100 MG PO TABS: 100.0000 mg | ORAL_TABLET | Freq: Two times a day (BID) | ORAL | Status: DC

## 2015-06-22 MED ORDER — PREDNISONE 10 MG PO TABS: 40.0000 mg | ORAL_TABLET | Freq: Every day | ORAL | Status: DC

## 2015-06-25 NOTE — Discharge Summary (Addendum)
Physician Discharge Summary  Hailey SchwalbeLinda Remache [id number] DOB: 09/27/1950 DOA: 06/20/2015  PCP: Carolynn SayersLEMENE,KRISTINA, NP  Admit date: 06/20/2015 Discharge date: 06/22/2015  Time spent: 50 minutes  Recommendations for Outpatient Follow-up:  1. Reassess COPD - if dyspnea on exertion persists after infection resolved, will need to add Steroid inhaler and Spiriva  Discharge Condition:stable    Discharge Diagnoses:  Principal Problem:   COPD exacerbation (HCC) Active Problems:   Pulmonary hypertension (HCC)   OSA (obstructive sleep apnea)   Hyponatremia   History of present illness:  Hailey [name] is a 64 y.o. female with COPD, obstructive sleep apnea and pulmonary hypertension who presents to the ER for shortness of breath. She is found to be wheezing and admitted for COPD exacerbation   Hospital Course:  Principal Problem:  COPD exacerbation / acute bronchitis - cont albuterol inhaler, doxycycline and O2 -symptoms improving- will give short course of Prednisone 40 mg daily  Active Problems: Hyponatremia -Sodium dropping to 129 from 132 - improved  to 136 with IV NS   OSA (obstructive sleep apnea) --Continue C Pap when asleep  Discharge Exam: Filed Weights   06/20/15 2200  Weight: 63.7 kg (140 lb 6.9 oz)   Filed Vitals:   06/21/15 2055 06/22/15 0504  BP: 116/63 119/72  Pulse: 92 90  Temp: 98.1 F (36.7 C) 97.8 F (36.6 C)  Resp: 20 20    General: AAO x 3, no distress Cardiovascular: RRR, no murmurs  Respiratory: clear to auscultation bilaterally GI: soft, non-tender, non-distended, bowel sound positive  Discharge Instructions You were cared for by a hospitalist during your hospital stay. If you have any questions about your discharge medications or the care you received while you were in the hospital after you are discharged, you can call the unit and asked to speak with the hospitalist on call if the hospitalist that took care of you is not available. Once you are  discharged, your primary care physician will handle any further medical issues. Please note that NO REFILLS for any discharge medications will be authorized once you are discharged, as it is imperative that you return to your primary care physician (or establish a relationship with a primary care physician if you do not have one) for your aftercare needs so that they can reassess your need for medications and monitor your lab values.  Discharge Instructions    Diet - low sodium heart healthy    Complete by:  As directed      Increase activity slowly    Complete by:  As directed             Medication List    TAKE these medications        albuterol 108 (90 BASE) MCG/ACT inhaler  Commonly known as:  PROAIR HFA  Inhale 2 puffs into the lungs 4 (four) times daily as needed for wheezing or shortness of breath.     doxycycline 100 MG tablet  Commonly known as:  VIBRA-TABS  Take 1 tablet (100 mg total) by mouth 2 (two) times daily.     predniSONE 10 MG tablet  Commonly known as:  DELTASONE  Take 4 tablets (40 mg total) by mouth daily with breakfast. 60 mg tomorrow, taper by 10 mg daily until complete     pseudoephedrine-guaifenesin 60-600 MG 12 hr tablet  Commonly known as:  MUCINEX D  Take 1 tablet by mouth 2 (two) times daily as needed for congestion.     Umeclidinium-Vilanterol 62.5-25 MCG/INH Aepb  Commonly  known as:  ANORO ELLIPTA  Inhale 1 puff into the lungs daily.     VITAMIN B-12 PO  Take 1 tablet by mouth daily.     vitamin C 500 MG tablet  Commonly known as:  ASCORBIC ACID  Take 500 mg by mouth daily.     Vitamin D 2000 UNITS Caps  Take 2,000 Units by mouth daily.     vitamin E 100 UNIT capsule  Take 200 Units by mouth daily.       No Known Allergies    The results of significant diagnostics from this hospitalization (including imaging, microbiology, ancillary and laboratory) are listed below for reference.    Significant Diagnostic Studies: Dg Chest 2  View  06/20/2015  CLINICAL DATA:  Acute onset of cough and congestion. Decreased O2 saturation and wheezing. Initial encounter. EXAM: CHEST  2 VIEW COMPARISON:  Chest radiograph performed 08/15/2013, and CT of the chest performed 09/15/2013 FINDINGS: The lungs are hyperexpanded, with flattening of the hemidiaphragms, compatible with COPD. There is no evidence of focal opacification, pleural effusion or pneumothorax. The heart is normal in size; the mediastinal contour is within normal limits. No acute osseous abnormalities are seen. IMPRESSION: Findings of COPD.  Lungs otherwise grossly clear. Electronically Signed   By: Roanna Raider M.D.   On: 06/20/2015 19:49    Microbiology: No results found for this or any previous visit (from the past 240 hour(s)).   Labs: Basic Metabolic Panel:  Recent Labs Lab 06/20/15 1931 06/21/15 0433 06/22/15 0408  NA 132* 129* 136  K 3.9 4.6 4.1  CL 99* 98* 106  CO2 GLUCOSE 214* 141* 123*  BUN CREATININE 0.72 0.50 0.50  CALCIUM 8.9 8.9 8.7*   Liver Function Tests: No results for input(s): AST, ALT, ALKPHOS, BILITOT, PROT, ALBUMIN in the last 168 hours. No results for input(s): LIPASE, AMYLASE in the last 168 hours. No results for input(s): AMMONIA in the last 168 hours. CBC:  Recent Labs Lab 06/20/15 1931 06/21/15 0433  WBC 12.7* 4.8  NEUTROABS 11.0*  --   HGB 13.4 12.5  HCT 39.1 36.1  MCV 90.1 88.0  PLT 239 243   Cardiac Enzymes: No results for input(s): CKTOTAL, CKMB, CKMBINDEX, TROPONINI in the last 168 hours. BNP: BNP (last 3 results) No results for input(s): BNP in the last 8760 hours.  ProBNP (last 3 results) No results for input(s): PROBNP in the last 8760 hours.  CBG:  Recent Labs Lab 06/21/15 1150 06/21/15 1638 06/21/15 2054 06/22/15 0734  GLUCAP 105* 146* 166* 120*       SignedCalvert Cantor, MD [facility] 06/22/2015, 9:46 AM

## 2015-07-30 ENCOUNTER — Ambulatory Visit: Payer: 59 | Admitting: Pulmonary Disease

## 2015-08-05 ENCOUNTER — Ambulatory Visit: Payer: 59 | Admitting: Pulmonary Disease

## 2015-09-24 ENCOUNTER — Ambulatory Visit (INDEPENDENT_AMBULATORY_CARE_PROVIDER_SITE_OTHER): Payer: 59 | Admitting: Pulmonary Disease

## 2015-09-24 ENCOUNTER — Encounter: Payer: Self-pay | Admitting: Pulmonary Disease

## 2015-09-24 VITALS — BP 122/80 | HR 75 | Ht 62.0 in | Wt 142.0 lb

## 2015-09-24 DIAGNOSIS — J432 Centrilobular emphysema: Secondary | ICD-10-CM | POA: Diagnosis not present

## 2015-09-24 MED ORDER — UMECLIDINIUM-VILANTEROL 62.5-25 MCG/INH IN AEPB: 1.0000 | INHALATION_SPRAY | Freq: Every day | RESPIRATORY_TRACT | Status: AC

## 2015-09-24 MED ORDER — ALBUTEROL SULFATE HFA 108 (90 BASE) MCG/ACT IN AERS: 2.0000 | INHALATION_SPRAY | RESPIRATORY_TRACT | Status: AC | PRN

## 2015-09-24 MED ORDER — ALBUTEROL SULFATE (2.5 MG/3ML) 0.083% IN NEBU: 2.5000 mg | INHALATION_SOLUTION | Freq: Four times a day (QID) | RESPIRATORY_TRACT | Status: AC | PRN

## 2015-09-24 NOTE — Progress Notes (Signed)
Current Outpatient Prescriptions on File Prior to Visit  Medication Sig  . albuterol (PROAIR HFA) 108 (90 BASE) MCG/ACT inhaler Inhale 2 puffs into the lungs 4 (four) times daily as needed for wheezing or shortness of breath. (Patient taking differently: Inhale 2 puffs into the lungs every 4 (four) hours as needed for wheezing or shortness of breath. )  . Ascorbic Acid (VITAMIN C) 500 MG tablet Take 500 mg by mouth daily.    . Cholecalciferol (VITAMIN D) 2000 UNITS CAPS Take 2,000 Units by mouth daily.  . Cyanocobalamin (VITAMIN B-12 PO) Take 1 tablet by mouth daily.  Marland Kitchen. Umeclidinium-Vilanterol (ANORO ELLIPTA) 62.5-25 MCG/INH AEPB Inhale 1 puff into the lungs daily. (Patient taking differently: Inhale 1-2 puffs into the lungs daily. )  . vitamin E 100 UNIT capsule Take 200 Units by mouth daily.   No current facility-administered medications on file prior to visit.    Chief Complaint  Patient presents with  . Follow-up    Has not worn CPAP since Dec 2016. Pt denies any current breathing issues. Moving to [place] in one week.    Tests CT chest 09/09/10 >> moderate emphysema Spirometry 10/01/10 >> FEV1 0.97(42%), FEV1% 36 CT chest 09/15/13 >> atherosclerosis, enlarged pulmonary arteries, moderate/severe centrilobular/paraseptal  ONO with RA 10/16/13 >> test time 8 hrs 1 min. Baseline SpO2 92%, low SpO2 78%. Spent 23 min with SpO2 < 88%. A1AT 11/06/13 >> 140, MM Echo 11/21/13 >> EF 65 to 70%, mild RV dilation. PFT 11/27/13 >> FEV1 1.09 (51%), FEV1% 51, TLC 4.76 (107%), DLCO 69%, +BD PSG 12/19/13 >> AHI 16.3, SaO2 low 84%. CPAP 8 cm H2O >> AHI 0.4, +R.   Past medical history Pneumothorax  PSHx, Medications, Allergies, Fhx, Shx reviewed.  Vital signs BP 122/80 mmHg  Pulse 75  Ht 5\' 2"  (1.575 m)  Wt 142 lb (64.411 kg)  BMI 25.97 kg/m2  SpO2 95%  History of Present Illness: Hailey [name] is a 65 y.o. 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  She is  moving to [place] next week.  She stopped using CPAP.  She feels she sleeps better w/o CPAP.  She still has device and will sometimes use during the day when she is more short of breath.  She is not having cough, wheeze, or chest congestion.  She feels anoro has helped.  She has needed proair recently.  She did have some trouble in December, and was told by her insurance nurse to ask about a nebulizer.  Physical Exam:   General - No distress ENT - No sinus tenderness, no oral exudate, no LAN Cardiac - s1s2 regular, no murmur Chest - No wheeze/rales/dullness Back - No focal tenderness Abd - Soft, non-tender Ext - No edema Neuro - Normal strength Skin - No rashes Psych - normal mood, and behavior   Assessment/Plan:  COPD with emphysema. She is concerned about use of ICS. Plan: - Anoro - Prn proair - will arrange for albuterol nebulizer  OSA. She was not able to tolerate CPAP. Plan: - monitor her sleep, and then she can resume CPAP if she is having more trouble  She will arrange for pulmonary follow up in [place].   Coralyn HellingVineet Marji Kuehnel, MD [facility]Sleep Pager:  [phone]

## 2015-09-24 NOTE — Patient Instructions (Signed)
Will have you speak with patient care coordinator to arrange for home nebulizer  Enjoy your move to [place]

## 2015-11-11 ENCOUNTER — Ambulatory Visit: Payer: 59 | Admitting: Pulmonary Disease

## 2016-08-31 IMAGING — CR DG CHEST 2V
2 series · 2 of 2 positions shown · non-contrast
Comparison: Chest radiograph performed 08/15/2013, and CT of the
chest performed 09/15/2013

CLINICAL DATA: Acute onset of cough and congestion. Decreased O2
saturation and wheezing. Initial encounter.

EXAM:
CHEST  2 VIEW

[w chest lat]
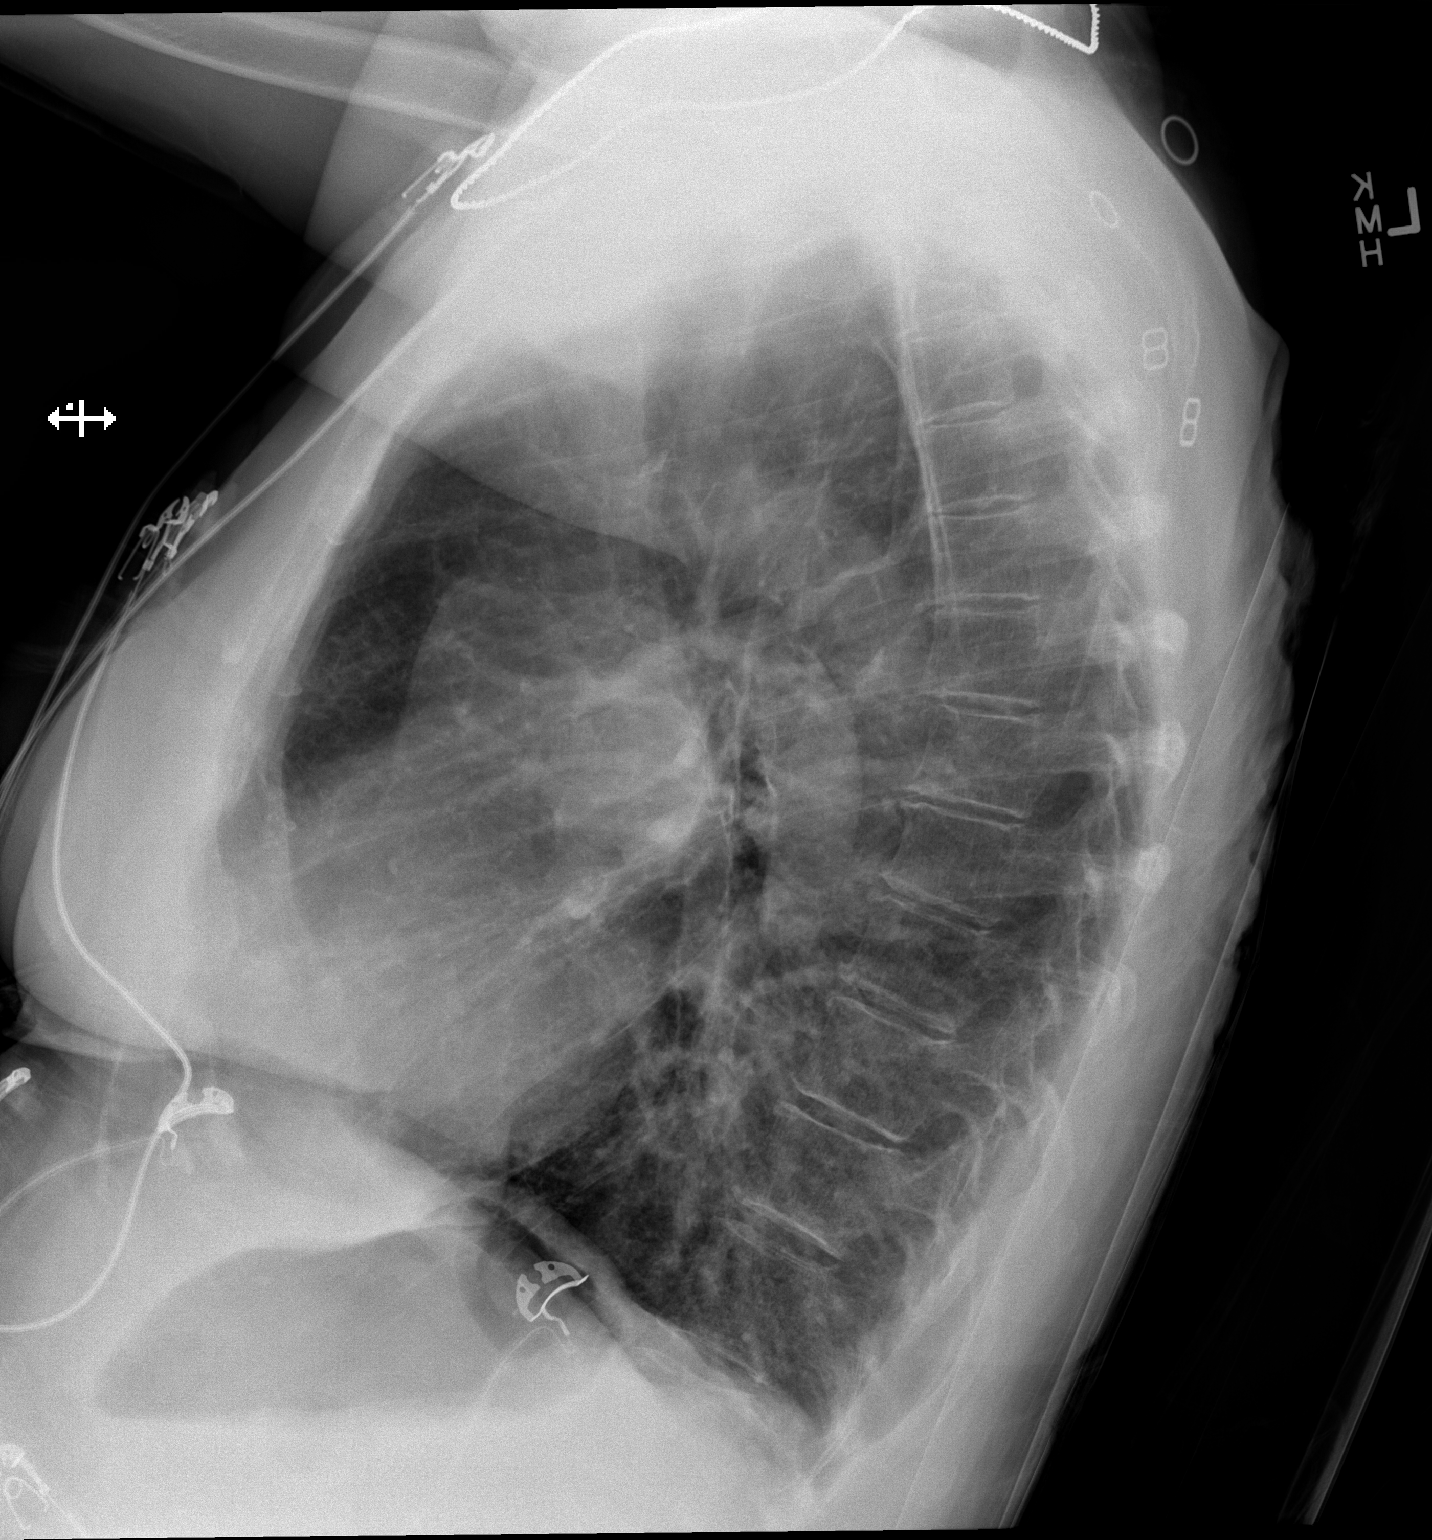

[x chest ap]
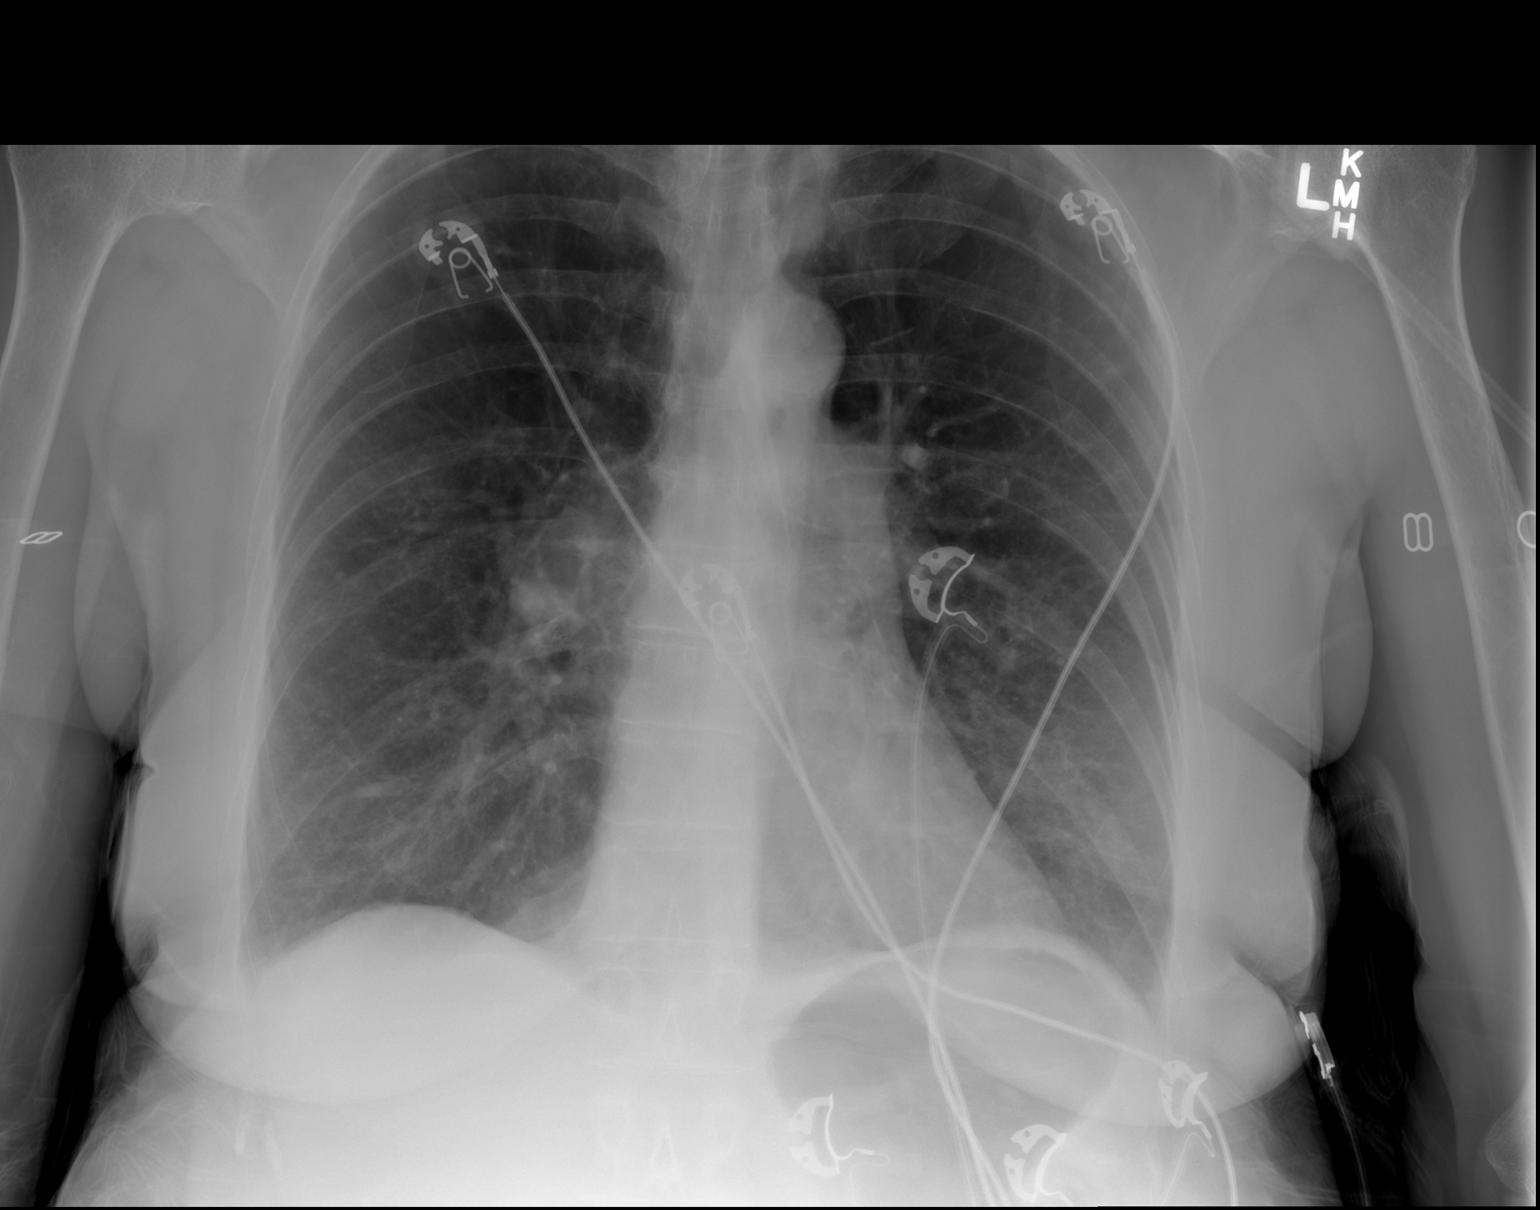

[2 of 2 positions shown; findings below may reference images not displayed]

FINDINGS: The lungs are hyperexpanded, with flattening of the hemidiaphragms,
compatible with COPD. There is no evidence of focal opacification,
pleural effusion or pneumothorax.

The heart is normal in size; the mediastinal contour is within
normal limits. No acute osseous abnormalities are seen.
IMPRESSION: Findings of COPD.  Lungs otherwise grossly clear.
